# Patient Record
Sex: Male | Born: 1965 | Race: Black or African American | Hispanic: No | Marital: Married | Smoking: Never smoker
Health system: Southern US, Community
[De-identification: ages and names within clinical notes are randomized; demographics above are authoritative.]

## PROBLEM LIST (undated history)

## (undated) DIAGNOSIS — E785 Hyperlipidemia, unspecified: Secondary | ICD-10-CM

---

## 1999-05-08 ENCOUNTER — Emergency Department (HOSPITAL_COMMUNITY): Admission: EM | Admit: 1999-05-08 | Discharge: 1999-05-08 | Payer: Self-pay | Admitting: Emergency Medicine

## 1999-05-08 ENCOUNTER — Encounter: Payer: Self-pay | Admitting: Emergency Medicine

## 2008-07-06 ENCOUNTER — Ambulatory Visit: Payer: Self-pay | Admitting: Family Medicine

## 2008-07-06 DIAGNOSIS — S0180XA Unspecified open wound of other part of head, initial encounter: Secondary | ICD-10-CM | POA: Insufficient documentation

## 2008-07-06 DIAGNOSIS — J069 Acute upper respiratory infection, unspecified: Secondary | ICD-10-CM | POA: Insufficient documentation

## 2015-06-04 ENCOUNTER — Emergency Department (INDEPENDENT_AMBULATORY_CARE_PROVIDER_SITE_OTHER): Payer: BLUE CROSS/BLUE SHIELD

## 2015-06-04 ENCOUNTER — Emergency Department
Admission: EM | Admit: 2015-06-04 | Discharge: 2015-06-04 | Disposition: A | Discharge status: Home / Self Care | Payer: BLUE CROSS/BLUE SHIELD | Source: Home / Self Care | Attending: Family Medicine | Admitting: Family Medicine

## 2015-06-04 ENCOUNTER — Encounter: Payer: Self-pay | Admitting: Emergency Medicine

## 2015-06-04 DIAGNOSIS — M79645 Pain in left finger(s): Secondary | ICD-10-CM | POA: Diagnosis not present

## 2015-06-04 DIAGNOSIS — S63602A Unspecified sprain of left thumb, initial encounter: Secondary | ICD-10-CM | POA: Diagnosis not present

## 2015-06-04 MED ORDER — MELOXICAM 7.5 MG PO TABS: ORAL_TABLET | ORAL | Status: DC

## 2015-06-04 NOTE — Discharge Instructions (Signed)
°  Meloxicam (Mobic) is an antiinflammatory to help with pain and inflammation.  Do not take ibuprofen, Advil, Aleve, or any other medications that contain NSAIDs while taking meloxicam as this may cause stomach upset or even ulcers if taken in large amounts for an extended period of time.  ° °

## 2015-06-04 NOTE — ED Provider Notes (Signed)
CSN: [id number]     Arrival date & time 06/04/15  1006 History   First MD Initiated Contact with Patient 06/04/15 1036     Chief Complaint  Patient presents with  . Hand Pain   (Consider location/radiation/quality/duration/timing/severity/associated sxs/prior Treatment) HPI  Pt is a 50yo male presenting to [facility] with c/o persistent Left thumb pain after pt jammed it about 5 months ago.  Pt states he accidentally hit it against a hard surface. Pain has been aching and sore at MCP joint since then.  Worse with palpation of the joint and certain movements especially when applying pressure to joint such as grasping objects. Pain is 5/10 at worst.  He has tried acetaminophen and ibuprofen with temporary relief. Denies numbness or tingling in the thumb. No other injuries. He is Right hand dominant.   History reviewed. No pertinent past medical history. History reviewed. No pertinent past surgical history. No family history on file. Social History  Substance Use Topics  . Smoking status: Never Smoker   . Smokeless tobacco: None  . Alcohol Use: Yes    Review of Systems  Musculoskeletal: Positive for myalgias and arthralgias. Negative for joint swelling.       Left thumb  Skin: Negative for color change, rash and wound.  Neurological: Negative for weakness and numbness.    Allergies  Review of patient's allergies indicates no known allergies.  Home Medications   Prior to Admission medications   Medication Sig Start Date End Date Taking? Authorizing Provider  meloxicam (MOBIC) 7.5 MG tablet Take 2 tabs (15mg ) once daily for 5 days, then 1 tab (7.5mg ) daily as needed for pain 06/04/15   Junius Finner, PA-C   Meds Ordered and Administered this Visit  Medications - No data to display  BP 128/88 mmHg  Pulse 79  Temp(Src) 97.9 F (36.6 C) (Oral)  Ht 5\' 11"  (1.803 m)  Wt 233 lb (105.688 kg)  BMI 32.51 kg/m2  SpO2 96% No data found.   Physical Exam  Constitutional: He is oriented to  person, place, and time. He appears well-developed and well-nourished.  HENT:  Head: Normocephalic and atraumatic.  Eyes: EOM are normal.  Neck: Normal range of motion.  Cardiovascular: Normal rate.   Left thumb: cap refill < 3 seconds  Pulmonary/Chest: Effort normal.  Musculoskeletal: Normal range of motion.  Left thumb: no edema or deformity. Tenderness to MCP joint. No crepitus. Full ROM. Increased pain with flexion and extension against resistance.  No "snuffbox" tenderness.   Neurological: He is alert and oriented to person, place, and time.  Left thumb: normal sensation  Skin: Skin is warm and dry.  Left thumb: skin in tact, no ecchymosis, erythema or warmth  Psychiatric: He has a normal mood and affect. His behavior is normal.  Nursing note and vitals reviewed.   ED Course  Procedures (including critical care time)  Labs Review Labs Reviewed - No data to display  Imaging Review Dg Hand Complete Left  06/04/2015  CLINICAL DATA:  Left thumb pain after trauma 5 months ago. EXAM: LEFT HAND - COMPLETE 3+ VIEW COMPARISON:  None. FINDINGS: There is no evidence of fracture or dislocation. There is no evidence of arthropathy or other focal bone abnormality. Soft tissues are unremarkable. IMPRESSION: Normal left hand. Electronically Signed   By: Lupita Raider, M.D.   On: 06/04/2015 11:29      MDM   1. Left thumb sprain, initial encounter     Pt c/o 5 months of persistent  Left thumb pain. PMS in tact. No evidence of underlying infection.  Plain films: Normal Will tx as sprain.   Discussed use of removable thumb spica splint. Pt declined for now. Rx: meloxicam Encouraged to f/u with Sports Medicine in 1-2 weeks if not improving. Patient verbalized understanding and agreement with treatment plan.   Junius Finnerrin O'Malley, PA-C 06/04/15 1146

## 2015-06-04 NOTE — ED Notes (Signed)
Left thumb pain x 5 months, jammed it and it still bothers him, 5/10 with activity

## 2015-06-04 NOTE — ED Notes (Signed)
Patient refused thumb spica

## 2015-07-13 ENCOUNTER — Emergency Department
Admission: EM | Admit: 2015-07-13 | Discharge: 2015-07-13 | Disposition: A | Discharge status: Home / Self Care | Payer: BLUE CROSS/BLUE SHIELD | Source: Home / Self Care | Attending: Family Medicine | Admitting: Family Medicine

## 2015-07-13 ENCOUNTER — Encounter: Payer: Self-pay | Admitting: Emergency Medicine

## 2015-07-13 DIAGNOSIS — J069 Acute upper respiratory infection, unspecified: Secondary | ICD-10-CM

## 2015-07-13 MED ORDER — BENZONATATE 100 MG PO CAPS: 100.0000 mg | ORAL_CAPSULE | Freq: Three times a day (TID) | ORAL | Status: DC

## 2015-07-13 NOTE — ED Provider Notes (Signed)
CSN: [id number]     Arrival date & time 07/13/15  1116 History   First MD Initiated Contact with Patient 07/13/15 1142     Chief Complaint  Patient presents with  . Cough   (Consider location/radiation/quality/duration/timing/severity/associated sxs/prior Treatment) HPI  The pt is a 50yo male presenting to [facility] with c/o mild intermittent dry cough with a subjective low grade fever two days ago.  Symptoms started about 5 days ago.  He feels run down with body aches. Cough is worse at night with mild shortness of breath and chest soreness with cough.  He has been taking mucinex with no relief. Denies hx of asthma. He has not taken acetaminophen or ibuprofen. Others sick at work. He has not received flu vaccine this year.   History reviewed. No pertinent past medical history. History reviewed. No pertinent past surgical history. History reviewed. No pertinent family history. Social History  Substance Use Topics  . Smoking status: Never Smoker   . Smokeless tobacco: None  . Alcohol Use: Yes    Review of Systems  Constitutional: Positive for fever, chills and fatigue.  HENT: Positive for congestion. Negative for ear pain, sore throat, trouble swallowing and voice change.   Respiratory: Positive for cough and shortness of breath.   Cardiovascular: Positive for chest pain. Negative for palpitations.  Gastrointestinal: Negative for nausea, vomiting, abdominal pain and diarrhea.  Musculoskeletal: Positive for myalgias and arthralgias. Negative for back pain.  Skin: Negative for rash.    Allergies  Review of patient's allergies indicates no known allergies.  Home Medications   Prior to Admission medications   Medication Sig Start Date End Date Taking? Authorizing Provider  benzonatate (TESSALON) 100 MG capsule Take 1 capsule (100 mg total) by mouth every 8 (eight) hours. 07/13/15   Junius Finner, PA-C  meloxicam (MOBIC) 7.5 MG tablet Take 2 tabs ( ) once daily for 5 days, then 1 tab  (7.5mg ) daily as needed for pain 06/04/15   Junius Finner, PA-C   Meds Ordered and Administered this Visit  Medications - No data to display  BP 134/88 mmHg  Pulse 96  Temp(Src) 98.7 F (37.1 C) (Oral)  Resp 16  Ht  (1.803 m)  Wt 240 lb 12 oz (109.203 kg)  BMI 33.59 kg/m2  SpO2 97% No data found.   Physical Exam  Constitutional: He appears well-developed and well-nourished.  HENT:  Head: Normocephalic and atraumatic.  Right Ear: Tympanic membrane normal.  Left Ear: Tympanic membrane normal.  Nose: Nose normal.  Mouth/Throat: Uvula is midline, oropharynx is clear and moist and mucous membranes are normal.  Eyes: Conjunctivae are normal. No scleral icterus.  Neck: Normal range of motion. Neck supple.  Cardiovascular: Normal rate, regular rhythm and normal heart sounds.   Pulmonary/Chest: Effort normal and breath sounds normal. No stridor. No respiratory distress. He has no wheezes. He has no rales. He exhibits no tenderness.  Abdominal: Soft. He exhibits no distension. There is no tenderness.  Musculoskeletal: Normal range of motion.  Lymphadenopathy:    He has no cervical adenopathy.  Neurological: He is alert.  Skin: Skin is warm and dry.  Nursing note and vitals reviewed.   ED Course  Procedures (including critical care time)  Labs Review Labs Reviewed - No data to display  Imaging Review No results found.     MDM   1. Acute upper respiratory infection    Pt c/o 5 days of URI symptoms. O2 Sat 97% on RA. Pt is afebrile. No signs  of bacterial infection on exam. Symptoms likely viral.   Rx: tessalon  Advised pt to use acetaminophen and ibuprofen as needed for fever and pain. Encouraged rest and fluids. F/u with PCP in 7-10 days if not improving, sooner if worsening. Pt verbalized understanding and agreement with tx plan.     Junius Finner, PA-C 07/13/15 1220

## 2015-07-13 NOTE — Discharge Instructions (Signed)
You may take 400-600mg  Ibuprofen (Motrin) every 6-8 hours for fever and pain  Alternate with Tylenol  You may take 500mg  Tylenol every 4-6 hours as needed for fever and pain  Follow-up with your primary care provider next week for recheck of symptoms if not improving.  Be sure to drink plenty of fluids and rest, at least 8hrs of sleep a night, preferably more while you are sick. Return urgent care or go to closest ER if you cannot keep down fluids/signs of dehydration, fever not reducing with Tylenol, difficulty breathing/wheezing, stiff neck, worsening condition, or other concerns (see below)    [employer] Mist Vaporizers Vaporizers may help relieve the symptoms of a cough and cold. They add moisture to the air, which helps mucus to become thinner and less sticky. This makes it easier to breathe and cough up secretions. Cool mist vaporizers do not cause serious burns like hot mist vaporizers, which may also be called steamers or humidifiers. Vaporizers have not been proven to help with colds. You should not use a vaporizer if you are allergic to mold. HOME CARE INSTRUCTIONS  Follow the package instructions for the vaporizer.  Do not use anything other than distilled water in the vaporizer.  Do not run the vaporizer all of the time. This can cause mold or bacteria to grow in the vaporizer.  Clean the vaporizer after each time it is used.  Clean and dry the vaporizer well before storing it.  Stop using the vaporizer if worsening respiratory symptoms develop.   This information is not intended to replace advice given to you by your health care provider. Make sure you discuss any questions you have with your health care provider.   Document Released: 02/12/2004 Document Revised: 05/22/2013 Document Reviewed: 10/04/2012 Elsevier Interactive Patient Education 2016 Elsevier Inc.  Cough, Adult A cough helps to clear your throat and lungs. A cough may last only 2-3 weeks (acute), or it may last  longer than 8 weeks (chronic). Many different things can cause a cough. A cough may be a sign of an illness or another medical condition. HOME CARE  Pay attention to any changes in your cough.  Take medicines only as told by your doctor.  If you were prescribed an antibiotic medicine, take it as told by your doctor. Do not stop taking it even if you start to feel better.  Talk with your doctor before you try using a cough medicine.  Drink enough fluid to keep your pee (urine) clear or pale yellow.  If the air is dry, use a cold steam vaporizer or humidifier in your home.  Stay away from things that make you cough at work or at home.  If your cough is worse at night, try using extra pillows to raise your head up higher while you sleep.  Do not smoke, and try not to be around smoke. If you need help quitting, ask your doctor.  Do not have caffeine.  Do not drink alcohol.  Rest as needed. GET HELP IF:  You have new problems (symptoms).  You cough up yellow fluid (pus).  Your cough does not get better after 2-3 weeks, or your cough gets worse.  Medicine does not help your cough and you are not sleeping well.  You have pain that gets worse or pain that is not helped with medicine.  You have a fever.  You are losing weight and you do not know why.  You have night sweats. GET HELP RIGHT AWAY IF:  You cough up blood.  You have trouble breathing.  Your heartbeat is very fast.   This information is not intended to replace advice given to you by your health care provider. Make sure you discuss any questions you have with your health care provider.   Document Released: 01/28/2011 Document Revised: 02/05/2015 Document Reviewed: 07/24/2014 Elsevier Interactive Patient Education 2016 Elsevier Inc.  Upper Respiratory Infection, Adult Most upper respiratory infections (URIs) are caused by a virus. A URI affects the nose, throat, and upper air passages. The most common type of  URI is often called "the common cold." HOME CARE   Take medicines only as told by your doctor.  Gargle warm saltwater or take cough drops to comfort your throat as told by your doctor.  Use a warm mist humidifier or inhale steam from a shower to increase air moisture. This may make it easier to breathe.  Drink enough fluid to keep your pee (urine) clear or pale yellow.  Eat soups and other clear broths.  Have a healthy diet.  Rest as needed.  Go back to work when your fever is gone or your doctor says it is okay.  You may need to stay home longer to avoid giving your URI to others.  You can also wear a face mask and wash your hands often to prevent spread of the virus.  Use your inhaler more if you have asthma.  Do not use any tobacco products, including cigarettes, chewing tobacco, or electronic cigarettes. If you need help quitting, ask your doctor. GET HELP IF:  You are getting worse, not better.  Your symptoms are not helped by medicine.  You have chills.  You are getting more short of breath.  You have brown or red mucus.  You have yellow or brown discharge from your nose.  You have pain in your face, especially when you bend forward.  You have a fever.  You have puffy (swollen) neck glands.  You have pain while swallowing.  You have white areas in the back of your throat. GET HELP RIGHT AWAY IF:   You have very bad or constant:  Headache.  Ear pain.  Pain in your forehead, behind your eyes, and over your cheekbones (sinus pain).  Chest pain.  You have long-lasting (chronic) lung disease and any of the following:  Wheezing.  Long-lasting cough.  Coughing up blood.  A change in your usual mucus.  You have a stiff neck.  You have changes in your:  Vision.  Hearing.  Thinking.  Mood. MAKE SURE YOU:   Understand these instructions.  Will watch your condition.  Will get help right away if you are not doing well or get worse.     This information is not intended to replace advice given to you by your health care provider. Make sure you discuss any questions you have with your health care provider.   Document Released: 11/03/2007 Document Revised: 10/01/2014 Document Reviewed: 08/22/2013 Elsevier Interactive Patient Education [employer]

## 2015-07-13 NOTE — ED Notes (Signed)
Patient presents to [facility] with c/o dry cough since Tuesday low grade fever two days ago, feels rundown and symptoms worse at night

## 2017-02-20 IMAGING — CR DG HAND COMPLETE 3+V*L*
3 series · 3 of 3 positions shown · non-contrast
Comparison: None.

CLINICAL DATA: Left thumb pain after trauma 5 months ago.

EXAM:
LEFT HAND - COMPLETE 3+ VIEW

[hand pa]
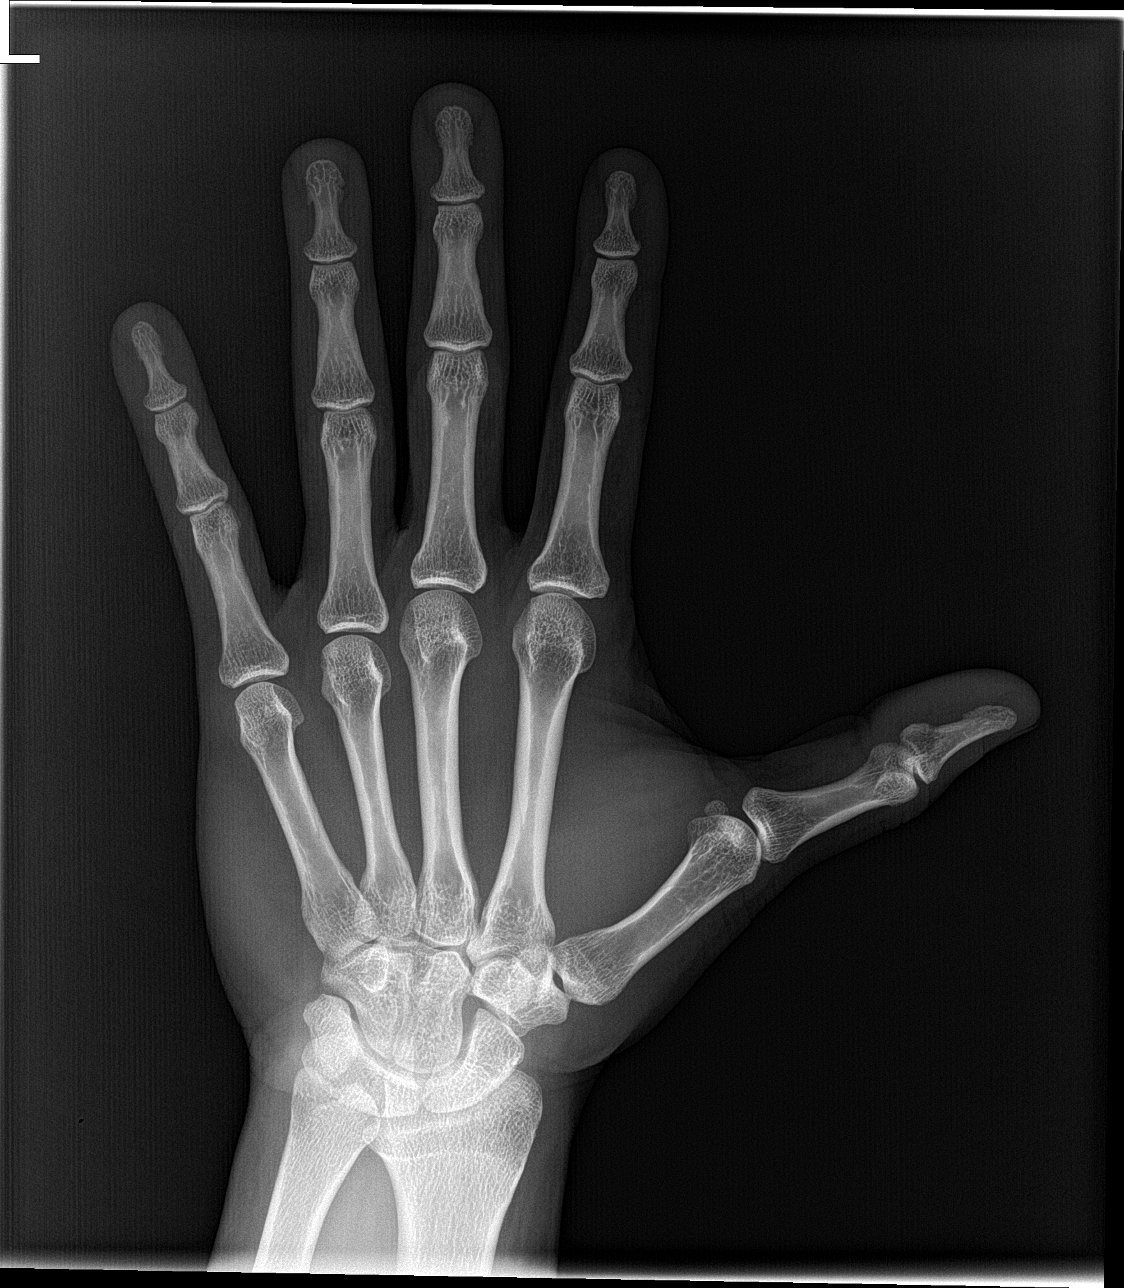

[hand obl]
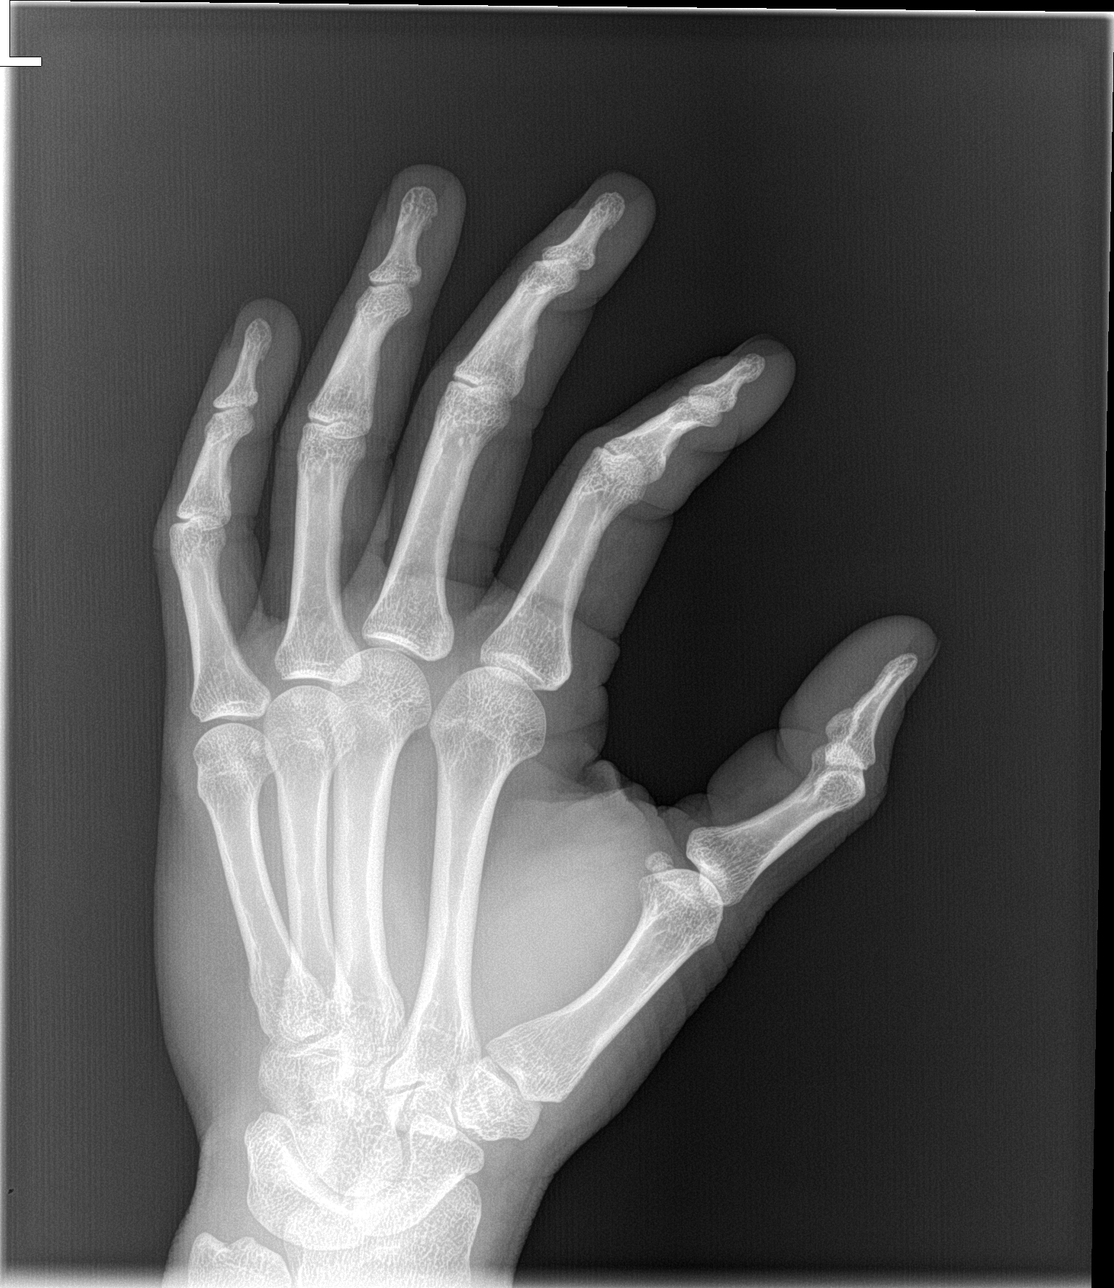

[hand lat]
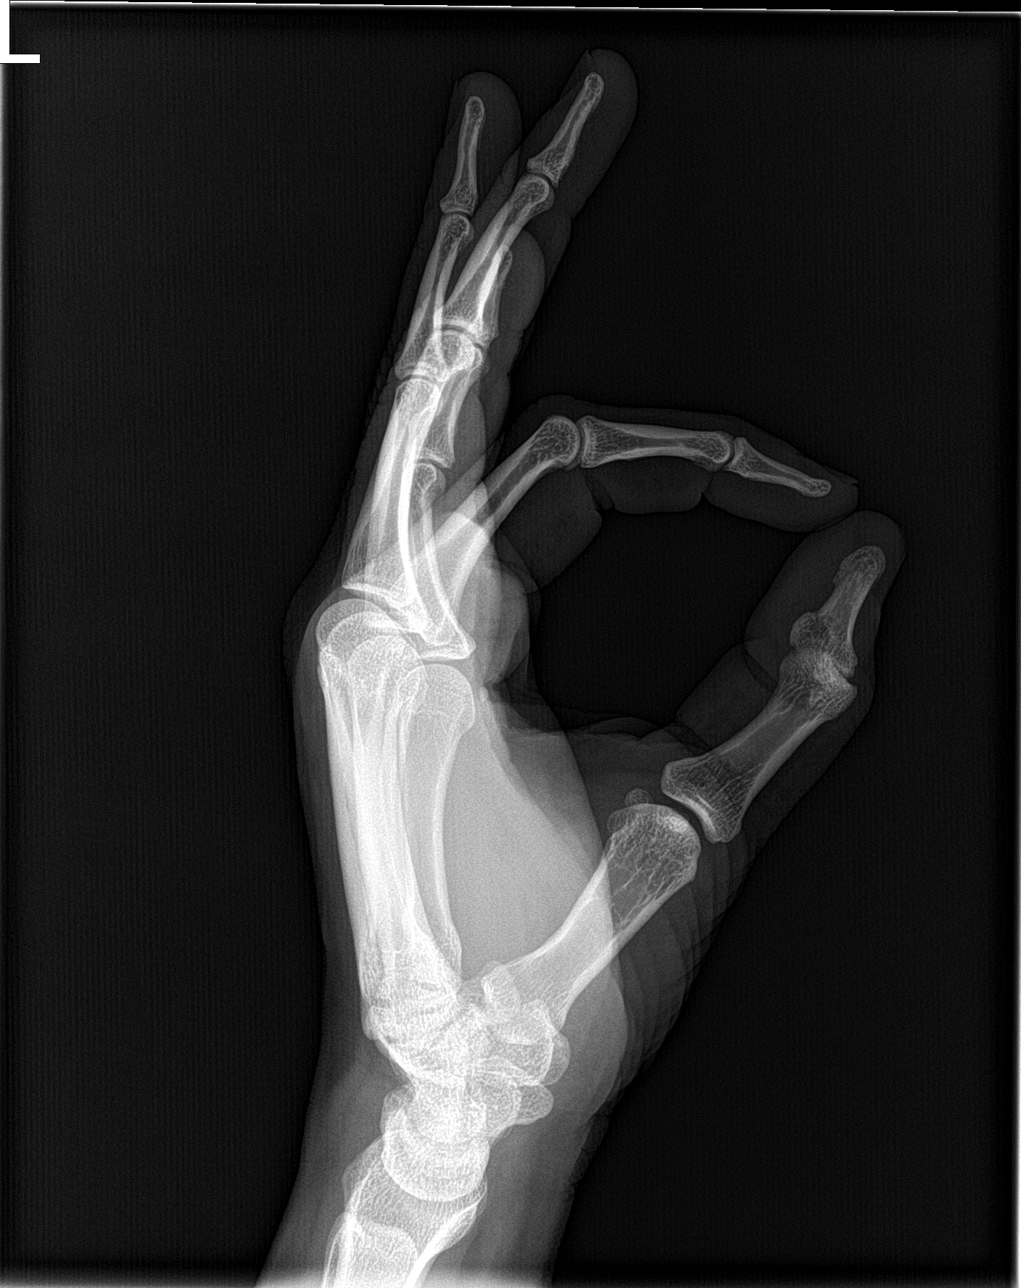

[3 of 3 positions shown; findings below may reference images not displayed]

FINDINGS: There is no evidence of fracture or dislocation. There is no
evidence of arthropathy or other focal bone abnormality. Soft
tissues are unremarkable.
IMPRESSION: Normal left hand.

## 2018-06-16 ENCOUNTER — Emergency Department (INDEPENDENT_AMBULATORY_CARE_PROVIDER_SITE_OTHER)
Admission: EM | Admit: 2018-06-16 | Discharge: 2018-06-16 | Disposition: A | Discharge status: Other Acute Inpatient Hospital | Payer: BLUE CROSS/BLUE SHIELD | Source: Home / Self Care | Attending: Emergency Medicine | Admitting: Emergency Medicine

## 2018-06-16 ENCOUNTER — Other Ambulatory Visit: Payer: Self-pay

## 2018-06-16 ENCOUNTER — Encounter: Payer: Self-pay | Admitting: *Deleted

## 2018-06-16 DIAGNOSIS — J101 Influenza due to other identified influenza virus with other respiratory manifestations: Secondary | ICD-10-CM

## 2018-06-16 DIAGNOSIS — R55 Syncope and collapse: Secondary | ICD-10-CM

## 2018-06-16 DIAGNOSIS — R9431 Abnormal electrocardiogram [ECG] [EKG]: Secondary | ICD-10-CM | POA: Diagnosis not present

## 2018-06-16 HISTORY — DX: Hyperlipidemia, unspecified: E78.5

## 2018-06-16 LAB — POCT INFLUENZA A/B
Influenza A, POC: POSITIVE — AB
Influenza B, POC: NEGATIVE

## 2018-06-16 LAB — POCT FASTING CBG KUC MANUAL ENTRY: POCT Glucose (KUC): 119 mg/dL — AB (ref 70–99)

## 2018-06-16 MED ORDER — GENERIC EXTERNAL MEDICATION
10.00 | Status: DC
Start: ? — End: 2018-06-16

## 2018-06-16 MED ORDER — SODIUM CHLORIDE 0.9 % IV SOLN
10.00 | INTRAVENOUS | Status: DC
Start: ? — End: 2018-06-16

## 2018-06-16 NOTE — Discharge Instructions (Addendum)
They are transporting you to the hospital for evaluation.

## 2018-06-16 NOTE — ED Provider Notes (Addendum)
[facility]    CSN: [id number] Arrival date & time: 06/16/18  0109     History   Chief Complaint Chief Complaint  Patient presents with  . Loss of Consciousness  . Fever    HPI Bobby Green is a 53 y.o. male.  Patient was in his usual state of health until this morning when he had an episode of syncope.  Of note he has been ill since Tuesday with fever chills sore throat and cough.  He has not had a flu shot.  He was taken to the room and had a second syncopal episode which lasted about 15 seconds.  The first episode was about 30 seconds.  He was somewhat slow to respond in our office but over the next 2 to 3 minutes responded.  There is no seizure-like activity with his syncope. HPI  Past Medical History:  Diagnosis Date  . Hyperlipidemia     Patient Active Problem List   Diagnosis Date Noted  . URI 07/06/2008  . LACERATION, FACE 07/06/2008    History reviewed. No pertinent surgical history.     Home Medications    Prior to Admission medications   Medication Sig Start Date End Date Taking? Authorizing Provider  pravastatin (PRAVACHOL) 40 MG tablet  06/12/18   [provider]    Family History History reviewed. No pertinent family history.  Social History Social History   Tobacco Use  . Smoking status: Never Smoker  . Smokeless tobacco: Never Used  Substance Use Topics  . Alcohol use: Yes  . Drug use: Not on file     Allergies   Patient has no known allergies.   Review of Systems Review of Systems  Constitutional: Positive for fatigue and fever.  HENT: Positive for sore throat.   Respiratory: Positive for cough.   Neurological: Positive for syncope.     Physical Exam Triage Vital Signs ED Triage Vitals [06/16/18 0902]  Enc Vitals Group     BP 124/78     Pulse Rate 85     Resp 14     Temp 99.2 F (37.3 C)     Temp Source Oral     SpO2 99 %     Weight 243 lb (110.2 kg)     Height      Head Circumference      Peak  Flow      Pain Score 0     Pain Loc      Pain Edu?      Excl. in GC?    No data found.  Updated Vital Signs BP 124/78 (BP Location: Right Arm)   Pulse 85   Temp 99.2 F (37.3 C) (Oral)   Resp 14   Wt 110.2 kg   SpO2 99%   BMI 33.89 kg/m   Visual Acuity Right Eye Distance:   Left Eye Distance:   Bilateral Distance:    Right Eye Near:   Left Eye Near:    Bilateral Near:     Physical Exam Constitutional:      Comments: Ill-appearing male lying flat initially not responsive but over 15 seconds became responsive answers questions and identified his wife and nurse.  No seizure activity noted.  HENT:     Nose: Nose normal.  Neck:     Musculoskeletal: Normal range of motion.  Cardiovascular:     Rate and Rhythm: Normal rate and regular rhythm.      UC Treatments / Results  Labs (all labs  ordered are listed, but only abnormal results are displayed) Labs Reviewed  POCT INFLUENZA A/B - Abnormal; Notable for the following components:      Result Value   Influenza A, POC Positive (*)    All other components within normal limits  POCT FASTING CBG [facility] MANUAL ENTRY - Abnormal; Notable for the following components:   POCT Glucose ([facility]) 119 (*)    All other components within normal limits    EKG EKG shows diffuse T wave changes to 3 and aVF as well as across the precordium. Radiology No results found.  Procedures Procedures (including critical care time)  Medications Ordered in UC Medications - No data to display  Initial Impression / Assessment and Plan / UC Course  I have reviewed the triage vital signs and the nursing notes.  Pertinent labs & imaging results that were available during my care of the patient were reviewed by me and considered in my medical decision making (see chart for details). Patient presents with syncopal episode x2 testing positive for influenza A.  His EKG is abnormal with diffuse T wave changes.  He is not complaining of chest pain or  shortness of breath at this time.  Will transport to the hospital via EMS for evaluation.  Blood sugar checked was 119.     Final Clinical Impressions(s) / UC Diagnoses   Final diagnoses:  Influenza A  Syncope, unspecified syncope type  Nonspecific abnormal electrocardiogram (ECG) (EKG)     Discharge Instructions     They are transporting you to the hospital for evaluation.    ED Prescriptions    None     Controlled Substance Prescriptions [place] Controlled Substance Registry consulted? Not Applicable   Collene Gobble, MD 06/16/18 1305    Collene Gobble, MD 06/16/18 1311

## 2018-06-16 NOTE — ED Triage Notes (Signed)
Patient reports 3 days of low grade fever, sweats/chills and minimal cough. His wife reports he has eaten very little and had little to drink in the past 2 days. This Am he took 1 sip of Theraflu, became diaphoretic and lost consciousness witnessed by his wife, for about 15 seconds. Fingerstick Glucose 119

## 2018-06-16 NOTE — ED Triage Notes (Signed)
While in exam room patient had 2nd event of loss of consciousness with diaphoresis, and mild shaking. Repeat BP 96/59 HR 58. Once awake recognized his wife and myself. MD in room. EKG Done, IV to left AC started, fluids open. EMS called for transport.

## 2018-06-18 ENCOUNTER — Telehealth: Payer: Self-pay

## 2018-06-18 NOTE — Telephone Encounter (Signed)
Pt says hes feeling better after his UC and ER visit. Advised pt to call with any questions. F/u with PCP for further care.

## 2023-06-09 ENCOUNTER — Ambulatory Visit (HOSPITAL_BASED_OUTPATIENT_CLINIC_OR_DEPARTMENT_OTHER)
Admission: EM | Admit: 2023-06-09 | Discharge: 2023-06-10 | Disposition: A | Discharge status: Home / Self Care | Payer: No Typology Code available for payment source | Attending: Emergency Medicine | Admitting: Emergency Medicine

## 2023-06-09 ENCOUNTER — Other Ambulatory Visit: Payer: Self-pay

## 2023-06-09 DIAGNOSIS — R7989 Other specified abnormal findings of blood chemistry: Secondary | ICD-10-CM | POA: Diagnosis not present

## 2023-06-09 DIAGNOSIS — R9431 Abnormal electrocardiogram [ECG] [EKG]: Secondary | ICD-10-CM

## 2023-06-09 DIAGNOSIS — Z79899 Other long term (current) drug therapy: Secondary | ICD-10-CM | POA: Insufficient documentation

## 2023-06-09 DIAGNOSIS — E785 Hyperlipidemia, unspecified: Secondary | ICD-10-CM | POA: Diagnosis not present

## 2023-06-09 DIAGNOSIS — I214 Non-ST elevation (NSTEMI) myocardial infarction: Secondary | ICD-10-CM | POA: Diagnosis present

## 2023-06-09 DIAGNOSIS — R55 Syncope and collapse: Secondary | ICD-10-CM | POA: Diagnosis present

## 2023-06-09 LAB — CBC
HCT: 42.4 % (ref 39.0–52.0)
Hemoglobin: 13.9 g/dL (ref 13.0–17.0)
MCH: 26.3 pg (ref 26.0–34.0)
MCHC: 32.8 g/dL (ref 30.0–36.0)
MCV: 80.3 fL (ref 80.0–100.0)
Platelets: 254 10*3/uL (ref 150–400)
RBC: 5.28 MIL/uL (ref 4.22–5.81)
RDW: 13.6 % (ref 11.5–15.5)
WBC: 8.6 10*3/uL (ref 4.0–10.5)
nRBC: 0 % (ref 0.0–0.2)

## 2023-06-09 LAB — BASIC METABOLIC PANEL
Anion gap: 13 (ref 5–15)
BUN: 20 mg/dL (ref 6–20)
CO2: 25 mmol/L (ref 22–32)
Calcium: 9.2 mg/dL (ref 8.9–10.3)
Chloride: 102 mmol/L (ref 98–111)
Creatinine, Ser: 1.84 mg/dL — ABNORMAL HIGH (ref 0.61–1.24)
GFR, Estimated: 42 mL/min — ABNORMAL LOW (ref >60)
Glucose, Bld: 105 mg/dL — ABNORMAL HIGH (ref 70–99)
Potassium: 3.5 mmol/L (ref 3.5–5.1)
Sodium: 140 mmol/L (ref 135–145)

## 2023-06-09 LAB — URINALYSIS, ROUTINE W REFLEX MICROSCOPIC
Bilirubin Urine: NEGATIVE
Glucose, UA: NEGATIVE mg/dL
Hgb urine dipstick: NEGATIVE
Ketones, ur: NEGATIVE mg/dL
Leukocytes,Ua: NEGATIVE
Nitrite: NEGATIVE
Protein, ur: NEGATIVE mg/dL
Specific Gravity, Urine: 1.02 (ref 1.005–1.030)
pH: 6.5 (ref 5.0–8.0)

## 2023-06-09 LAB — CBG MONITORING, ED: Glucose-Capillary: 121 mg/dL — ABNORMAL HIGH (ref 70–99)

## 2023-06-09 LAB — TROPONIN I (HIGH SENSITIVITY): Troponin I (High Sensitivity): 31 ng/L — ABNORMAL HIGH (ref <18)

## 2023-06-09 MED ORDER — SODIUM CHLORIDE 0.9 % IV BOLUS
1000.0000 mL | Freq: Once | INTRAVENOUS | Status: AC
  Administered 2023-06-09: 1000 mL via INTRAVENOUS

## 2023-06-09 NOTE — ED Provider Notes (Signed)
 Care assumed at shift change. Here with syncope while at the gym. Has an abnormal EKG and borderline elevated Trop #1. Awaiting delta and plan for admission. Cards vs Hospitalist.  Physical Exam  BP 122/80   Pulse 84   Temp 98.3 F (36.8 C)   Resp 18   Ht 5' 11 (1.803 m)   Wt 111.1 kg   SpO2 96%   BMI 34.17 kg/m   Physical Exam  Procedures  .Critical Care  Performed by: Roselyn Carlin NOVAK, MD Authorized by: Roselyn Carlin NOVAK, MD   Critical care provider statement:    Critical care time (minutes):  35   Critical care time was exclusive of:  Separately billable procedures and treating other patients   Critical care was necessary to treat or prevent imminent or life-threatening deterioration of the following conditions:  Cardiac failure   Critical care was time spent personally by me on the following activities:  Development of treatment plan with patient or surrogate, discussions with consultants, evaluation of patient's response to treatment, examination of patient, ordering and review of laboratory studies, ordering and review of radiographic studies, ordering and performing treatments and interventions, pulse oximetry, re-evaluation of patient's condition and review of old charts   Care discussed with: admitting provider     ED Course / MDM   Clinical Course as of 06/10/23 0044  Thu Jun 09, 2023  2243 Discussed with patient that I would like to admit him to the hospital for further evaluation and workup of syncope with concerning EKG changes. He is adamant he does not want to stay in the hospital. I explained the AMA process to him. His wife will try to convince him to stay.  [LR]  Fri Jun 10, 2023  0034 Repeat Trop is increased to 401. Patient remains asymptomatic. Repeat EKG not significantly changed. Will discuss with cardiology, patient now amenable to admission.  [CS]  9957 Spoke with Dr. Floretta, Cardiology, who will accept for admission. Heparin  and ASA ordered. [CS]     Clinical Course User Index [CS] Roselyn Carlin NOVAK, MD [LR] Corie Friddie DASEN, PA-C   Medical Decision Making Problems Addressed: Abnormal EKG: acute illness or injury NSTEMI (non-ST elevated myocardial infarction) [facility]): acute illness or injury Syncope, unspecified syncope type: acute illness or injury  Amount and/or Complexity of Data Reviewed Labs: ordered. Decision-making details documented in ED Course. ECG/medicine tests: ordered and independent interpretation performed. Decision-making details documented in ED Course.  Risk OTC drugs. Prescription drug management. Decision regarding hospitalization.          Roselyn Carlin NOVAK, MD 06/10/23 [phone]

## 2023-06-09 NOTE — ED Triage Notes (Signed)
 Pt POV after syncopal episode at gym. Witnessed syncopal episode in ED lobby as well, diaphoretic.

## 2023-06-09 NOTE — ED Provider Notes (Signed)
  EMERGENCY DEPARTMENT AT [facility] Provider Note   CSN: [id number] Arrival date & time: 06/09/23  2059     History  Chief Complaint  Patient presents with   Loss of Consciousness    Bobby Green is a 58 y.o. male with history of hyperlipidemia presents the emergency department after syncopal episode.  Patient was working out at [employer].  He states that he walked 3 miles and was using some weight machines, when he started feeling very lightheaded.  He did not have any chest pain.  He checked his blood pressure and says that it was having a hard time reading.  He waited a few minutes and so that he still felt very lightheaded.  Reported by bystanders and had a syncopal episode.  He had an additional witnessed episode in the ER lobby.  He believes he is dehydrated, he states that he does not frequently drink water.  He felt fine during the rest of his workout until the episode in question today.  Patient last saw cardiologist and had stress test about 15 years ago.  Reports he has had a few syncopal episodes in the past, most recently about a year and a half ago.   Loss of Consciousness      Home Medications Prior to Admission medications   Medication Sig Start Date End Date Taking? Authorizing Provider  pravastatin  (PRAVACHOL ) 40 MG tablet  06/12/18   [provider]      Allergies    Patient has no known allergies.    Review of Systems   Review of Systems  Cardiovascular:  Positive for syncope.  Neurological:  Positive for syncope.  All other systems reviewed and are negative.   Physical Exam Updated Vital Signs BP 96/67   Pulse 80   Resp 19   Ht 5' 11 (1.803 m)   Wt 111.1 kg   SpO2 98%   BMI 34.17 kg/m  Physical Exam Vitals and nursing note reviewed.  Constitutional:      Appearance: Normal appearance.  HENT:     Head: Normocephalic and atraumatic.  Eyes:     Conjunctiva/sclera: Conjunctivae normal.  Cardiovascular:      Rate and Rhythm: Normal rate and regular rhythm.  Pulmonary:     Effort: Pulmonary effort is normal. No respiratory distress.     Breath sounds: Normal breath sounds.  Abdominal:     General: There is no distension.     Palpations: Abdomen is soft.     Tenderness: There is no abdominal tenderness.  Skin:    General: Skin is warm and dry.  Neurological:     General: No focal deficit present.     Mental Status: He is alert.     ED Results / Procedures / Treatments   Labs (all labs ordered are listed, but only abnormal results are displayed) Labs Reviewed  BASIC METABOLIC PANEL - Abnormal; Notable for the following components:      Result Value   Glucose, Bld 105 (*)    Creatinine, Ser 1.84 (*)    GFR, Estimated 42 (*)    All other components within normal limits  CBG MONITORING, ED - Abnormal; Notable for the following components:   Glucose-Capillary 121 (*)    All other components within normal limits  TROPONIN I (HIGH SENSITIVITY) - Abnormal; Notable for the following components:   Troponin I (High Sensitivity) 31 (*)    All other components within normal limits  CBC  URINALYSIS, ROUTINE  W REFLEX MICROSCOPIC  TROPONIN I (HIGH SENSITIVITY)    EKG EKG Interpretation Date/Time:  Thursday June 09 2023 21:04:57 EST Ventricular Rate:  82 PR Interval:  179 QRS Duration:  95 QT Interval:  427 QTC Calculation: 499 R Axis:   46  Text Interpretation: Sinus rhythm Prominent P waves, nondiagnostic Abnormal R-wave progression, early transition Repol abnrm, severe global ischemia (LM/MVD) Since last tracing Worsening ST depression and T-wave inversion. Confirmed by Roselyn Dunnings [phone]) on 06/09/2023 11:21:48 PM  Radiology No results found.  Procedures Procedures    Medications Ordered in ED Medications  sodium chloride  0.9 % bolus 1,000 mL (0 mLs Intravenous Stopped 06/09/23 2225)    ED Course/ Medical Decision Making/ A&P Clinical Course as of 06/09/23 2332  Thu  Jun 09, 2023  2243 Discussed with patient that I would like to admit him to the hospital for further evaluation and workup of syncope with concerning EKG changes. He is adamant he does not want to stay in the hospital. I explained the AMA process to him. His wife will try to convince him to stay.  [LR]    Clinical Course User Index [LR] Sevon Rotert, Friddie DASEN, PA-C                                 Medical Decision Making Amount and/or Complexity of Data Reviewed Labs: ordered.   This patient is a 59 y.o. male who presents to the ED for concern of syncope, this involves an extensive number of treatment options, and is a complaint that carries with it a high risk of complications and morbidity. The emergent differential diagnosis prior to evaluation includes, but is not limited to,  CVA, ACS, arrhythmia, vasovagal syncope, orthostatic hypotension, sepsis, hypoglycemia, electrolyte disturbance, respiratory failure, symptomatic anemia, dehydration, heat injury, polypharmacy, malignancy, anxiety/panic attack. This is not an exhaustive differential.   Past Medical History / Co-morbidities / Social History: HLD  Additional history: Chart reviewed. Pertinent results include: reviewed ER visit note from 2020 after syncopal episode, was thought to be due to dehydration in setting of influenza  Physical Exam: Physical exam performed. The pertinent findings include: hypotensive to 83/49. Lying flat in exam bed, awake and oriented. Not complaining of any pain. Heart regular rate and rhythm. Lung sounds clear. No peripheral edema.   Lab Tests: I ordered, and personally interpreted labs.  The pertinent results include: CBC normal.  BMP with creatinine 1.4, normal BUN, GFR 42.  Compared to prior labs from 2020, kidney function slightly worse compared to prior.   Imaging Studies: Bedside echo performed by attending physician Dr Mannie, appears normal.    Cardiac Monitoring:  The patient was maintained on a  cardiac monitor.  My attending physician Dr. Mannie viewed and interpreted the cardiac monitored which showed an underlying rhythm of: sinus rhythm, with worsening T wave inversion globally compared to prior. I agree with this interpretation.   Medications: I ordered medication including IVF  for hypotension. Reevaluation of the patient after these medicines showed that the patient improved. I have reviewed the patients home medicines and have made adjustments as needed.   Disposition: Patient discussed and care transferred to attending physician Dr. Roselyn at shift change. Please see his/her note for further details regarding further ED course and disposition. Plan at time of handoff is follow up on delta troponin. At time of lab drawn, patient still hesitant about staying in the hospital. Plan for reassessment  once delta troponin resulted and determine disposition. Patient will likely still need admission, and would need to sign out AMA if he refuses that.    I discussed this case with my attending physician Dr. Mannie who cosigned this note including patient's presenting symptoms, physical exam, and planned diagnostics and interventions. Attending physician stated agreement with plan or made changes to plan which were implemented.    Final Clinical Impression(s) / ED Diagnoses Final diagnoses:  Syncope, unspecified syncope type  Abnormal EKG    Rx / DC Orders ED Discharge Orders     None      Portions of this report may have been transcribed using voice recognition software. Every effort was made to ensure accuracy; however, inadvertent computerized transcription errors may be present.    Bali Lyn T, PA-C 06/09/23 2333    Mannie Pac T, DO 06/13/23 [phone]

## 2023-06-09 NOTE — ED Notes (Signed)
2nd troponin drawn

## 2023-06-10 ENCOUNTER — Encounter (HOSPITAL_BASED_OUTPATIENT_CLINIC_OR_DEPARTMENT_OTHER): Payer: Self-pay | Admitting: Cardiovascular Disease

## 2023-06-10 ENCOUNTER — Encounter (HOSPITAL_COMMUNITY)
Admission: EM | Disposition: A | Discharge status: Home / Self Care | Payer: Self-pay | Source: Home / Self Care | Attending: Emergency Medicine

## 2023-06-10 ENCOUNTER — Telehealth: Payer: Self-pay | Admitting: Cardiology

## 2023-06-10 DIAGNOSIS — R55 Syncope and collapse: Secondary | ICD-10-CM

## 2023-06-10 DIAGNOSIS — E785 Hyperlipidemia, unspecified: Secondary | ICD-10-CM | POA: Insufficient documentation

## 2023-06-10 DIAGNOSIS — I214 Non-ST elevation (NSTEMI) myocardial infarction: Secondary | ICD-10-CM | POA: Diagnosis present

## 2023-06-10 DIAGNOSIS — R7989 Other specified abnormal findings of blood chemistry: Secondary | ICD-10-CM

## 2023-06-10 DIAGNOSIS — Z79899 Other long term (current) drug therapy: Secondary | ICD-10-CM | POA: Diagnosis not present

## 2023-06-10 HISTORY — PX: LEFT HEART CATH AND CORONARY ANGIOGRAPHY: CATH118249

## 2023-06-10 LAB — TROPONIN I (HIGH SENSITIVITY)
Troponin I (High Sensitivity): 1405 ng/L (ref <18)
Troponin I (High Sensitivity): 401 ng/L (ref <18)
Troponin I (High Sensitivity): 724 ng/L (ref <18)

## 2023-06-10 LAB — HEPATIC FUNCTION PANEL
ALT: 17 U/L (ref 0–44)
ALT: 21 U/L (ref 0–44)
AST: 22 U/L (ref 15–41)
AST: 26 U/L (ref 15–41)
Albumin: 4 g/dL (ref 3.5–5.0)
Albumin: 3.4 g/dL — ABNORMAL LOW (ref 3.5–5.0)
Alkaline Phosphatase: 39 U/L (ref 38–126)
Alkaline Phosphatase: 38 U/L (ref 38–126)
Bilirubin, Direct: 0.1 mg/dL (ref 0.0–0.2)
Bilirubin, Direct: 0.2 mg/dL (ref 0.0–0.2)
Indirect Bilirubin: 0.5 mg/dL (ref 0.3–0.9)
Indirect Bilirubin: 0.7 mg/dL (ref 0.3–0.9)
Total Bilirubin: 0.6 mg/dL (ref 0.0–1.2)
Total Bilirubin: 0.9 mg/dL (ref 0.0–1.2)
Total Protein: 6.5 g/dL (ref 6.5–8.1)
Total Protein: 6.3 g/dL — ABNORMAL LOW (ref 6.5–8.1)

## 2023-06-10 LAB — LIPID PANEL
Cholesterol: 257 mg/dL — ABNORMAL HIGH (ref 0–200)
Cholesterol: 250 mg/dL — ABNORMAL HIGH (ref 0–200)
HDL: 45 mg/dL (ref >40)
HDL: 43 mg/dL (ref >40)
LDL Cholesterol: 195 mg/dL — ABNORMAL HIGH (ref 0–99)
LDL Cholesterol: 187 mg/dL — ABNORMAL HIGH (ref 0–99)
Total CHOL/HDL Ratio: 5.7 {ratio}
Total CHOL/HDL Ratio: 5.8 {ratio}
Triglycerides: 85 mg/dL (ref <150)
Triglycerides: 101 mg/dL (ref <150)
VLDL: 17 mg/dL (ref 0–40)
VLDL: 20 mg/dL (ref 0–40)

## 2023-06-10 LAB — MAGNESIUM: Magnesium: 2.1 mg/dL (ref 1.7–2.4)

## 2023-06-10 LAB — BASIC METABOLIC PANEL
Anion gap: 6 (ref 5–15)
BUN: 16 mg/dL (ref 6–20)
CO2: 25 mmol/L (ref 22–32)
Calcium: 8.7 mg/dL — ABNORMAL LOW (ref 8.9–10.3)
Chloride: 104 mmol/L (ref 98–111)
Creatinine, Ser: 1.19 mg/dL (ref 0.61–1.24)
GFR, Estimated: >60 mL/min (ref >60)
Glucose, Bld: 101 mg/dL — ABNORMAL HIGH (ref 70–99)
Potassium: 3.9 mmol/L (ref 3.5–5.1)
Sodium: 135 mmol/L (ref 135–145)

## 2023-06-10 LAB — HEPARIN LEVEL (UNFRACTIONATED): Heparin Unfractionated: 0.57 [IU]/mL (ref 0.30–0.70)

## 2023-06-10 LAB — TSH: TSH: 0.579 u[IU]/mL (ref 0.350–4.500)

## 2023-06-10 SURGERY — LEFT HEART CATH AND CORONARY ANGIOGRAPHY
Anesthesia: LOCAL

## 2023-06-10 MED ORDER — SODIUM CHLORIDE 0.9 % IV SOLN: INTRAVENOUS | Status: DC

## 2023-06-10 MED ORDER — POTASSIUM CHLORIDE CRYS ER 20 MEQ PO TBCR
EXTENDED_RELEASE_TABLET | ORAL | Status: AC
  Filled 2023-06-10: qty 1

## 2023-06-10 MED ORDER — ASPIRIN 81 MG PO CHEW
324.0000 mg | CHEWABLE_TABLET | Freq: Once | ORAL | Status: AC
  Administered 2023-06-10: 324 mg via ORAL
  Filled 2023-06-10: qty 4

## 2023-06-10 MED ORDER — MIDAZOLAM HCL 2 MG/2ML IJ SOLN
INTRAMUSCULAR | Status: DC | PRN
  Administered 2023-06-10: 2 mg via INTRAVENOUS

## 2023-06-10 MED ORDER — SODIUM CHLORIDE 0.9% FLUSH: 3.0000 mL | INTRAVENOUS | Status: DC | PRN

## 2023-06-10 MED ORDER — HEPARIN (PORCINE) IN NACL 1000-0.9 UT/500ML-% IV SOLN
INTRAVENOUS | Status: DC | PRN
  Administered 2023-06-10: 1000 mL

## 2023-06-10 MED ORDER — FENTANYL CITRATE (PF) 100 MCG/2ML IJ SOLN
INTRAMUSCULAR | Status: AC
  Filled 2023-06-10: qty 2

## 2023-06-10 MED ORDER — HEPARIN (PORCINE) 25000 UT/250ML-% IV SOLN
1300.0000 [IU]/h | INTRAVENOUS | Status: DC
  Administered 2023-06-10: 1300 [IU]/h via INTRAVENOUS
  Filled 2023-06-10: qty 250

## 2023-06-10 MED ORDER — SODIUM CHLORIDE 0.9% FLUSH: 3.0000 mL | Freq: Two times a day (BID) | INTRAVENOUS | Status: DC

## 2023-06-10 MED ORDER — HEPARIN BOLUS VIA INFUSION
4000.0000 [IU] | Freq: Once | INTRAVENOUS | Status: AC
  Administered 2023-06-10: 4000 [IU] via INTRAVENOUS

## 2023-06-10 MED ORDER — LIDOCAINE HCL (PF) 1 % IJ SOLN
INTRAMUSCULAR | Status: DC | PRN
  Administered 2023-06-10: 2 mL

## 2023-06-10 MED ORDER — ACETAMINOPHEN 325 MG PO TABS: 650.0000 mg | ORAL_TABLET | ORAL | Status: DC | PRN

## 2023-06-10 MED ORDER — HEPARIN SODIUM (PORCINE) 1000 UNIT/ML IJ SOLN
INTRAMUSCULAR | Status: AC
  Filled 2023-06-10: qty 10

## 2023-06-10 MED ORDER — MIDAZOLAM HCL 2 MG/2ML IJ SOLN
INTRAMUSCULAR | Status: AC
  Filled 2023-06-10: qty 2

## 2023-06-10 MED ORDER — VERAPAMIL HCL 2.5 MG/ML IV SOLN
INTRAVENOUS | Status: AC
Start: 2023-06-10 — End: ?
  Filled 2023-06-10: qty 2

## 2023-06-10 MED ORDER — IOHEXOL 350 MG/ML SOLN
INTRAVENOUS | Status: DC | PRN
  Administered 2023-06-10: 45 mL

## 2023-06-10 MED ORDER — HEPARIN SODIUM (PORCINE) 1000 UNIT/ML IJ SOLN
INTRAMUSCULAR | Status: DC | PRN
  Administered 2023-06-10: 5000 [IU] via INTRAVENOUS

## 2023-06-10 MED ORDER — VERAPAMIL HCL 2.5 MG/ML IV SOLN
INTRAVENOUS | Status: DC | PRN
  Administered 2023-06-10: 10 mL via INTRA_ARTERIAL

## 2023-06-10 MED ORDER — POTASSIUM CHLORIDE CRYS ER 20 MEQ PO TBCR
10.0000 meq | EXTENDED_RELEASE_TABLET | Freq: Once | ORAL | Status: AC
  Administered 2023-06-10: 10 meq via ORAL

## 2023-06-10 MED ORDER — ONDANSETRON HCL 4 MG/2ML IJ SOLN: 4.0000 mg | Freq: Four times a day (QID) | INTRAMUSCULAR | Status: DC | PRN

## 2023-06-10 MED ORDER — LIDOCAINE HCL (PF) 1 % IJ SOLN
INTRAMUSCULAR | Status: AC
Start: 2023-06-10 — End: ?
  Filled 2023-06-10: qty 30

## 2023-06-10 MED ORDER — FENTANYL CITRATE (PF) 100 MCG/2ML IJ SOLN
INTRAMUSCULAR | Status: DC | PRN
  Administered 2023-06-10: 25 ug via INTRAVENOUS

## 2023-06-10 MED ORDER — HYDRALAZINE HCL 20 MG/ML IJ SOLN
10.0000 mg | INTRAMUSCULAR | Status: DC | PRN
Start: 2023-06-10 — End: 2023-06-10

## 2023-06-10 MED ORDER — SODIUM CHLORIDE 0.9 % IV SOLN: 250.0000 mL | INTRAVENOUS | Status: DC | PRN

## 2023-06-10 SURGICAL SUPPLY — 9 items
CATH 5FR JL3.5 JR4 ANG PIG MP (CATHETERS) IMPLANT
DEVICE RAD COMP TR BAND LRG (VASCULAR PRODUCTS) IMPLANT
GLIDESHEATH SLEND SS 6F .021 (SHEATH) IMPLANT
GUIDEWIRE INQWIRE 1.5J.035X260 (WIRE) IMPLANT
INQWIRE 1.5J .035X260CM (WIRE) ×1
KIT SYRINGE INJ CVI SPIKEX1 (MISCELLANEOUS) IMPLANT
PACK CARDIAC CATHETERIZATION (CUSTOM PROCEDURE TRAY) ×1 IMPLANT
SET ATX-X65L (MISCELLANEOUS) IMPLANT
SHEATH PROBE COVER 6X72 (BAG) IMPLANT

## 2023-06-10 NOTE — Interval H&P Note (Signed)
 History and Physical Interval Note:  06/10/2023 3:07 PM  Bobby Green  has presented today for surgery, with the diagnosis of nstemi.  The various methods of treatment have been discussed with the patient and family. After consideration of risks, benefits and other options for treatment, the patient has consented to  Procedure(s): LEFT HEART CATH AND CORONARY ANGIOGRAPHY (N/A) as a surgical intervention.  The patient's history has been reviewed, patient examined, no change in status, stable for surgery.  I have reviewed the patient's chart and labs.  Questions were answered to the patient's satisfaction.    Cath Lab Visit (complete for each Cath Lab visit)  Clinical Evaluation Leading to the Procedure:   ACS: Yes.    Non-ACS:    Anginal Classification: CCS I  Anti-ischemic medical therapy: No Therapy  Non-Invasive Test Results: No non-invasive testing performed  Prior CABG: No previous CABG       [name] 06/10/2023  3:07 PM

## 2023-06-10 NOTE — ED Notes (Signed)
 [facility] called stating cl on the way to transport pt to cath lab

## 2023-06-10 NOTE — Telephone Encounter (Signed)
   Transition of Care Follow-up Phone Call Request    Patient Name: Bobby Green Date of Birth: March 04, 1966 Date of Encounter: 06/10/2023  Primary Care Provider:  Ilah Crigler, MD Primary Cardiologist:  None  Barnet Keach has been scheduled for a transition of care follow up appointment with a [facility] provider:  Friday Jun 24, 2023  Appt at 8:25 AM (25 min)      Bobby Green  Will plan to discharge patient on 2-week live ZIO monitor for syncope.  Dr. Jordan to read.  I have talked to [facility] with echocardiograms and she said she would schedule patient and call him on Monday to get this arranged.  Please be sure that this gets done sooner than later, should be done next week.  Please reach out to [facility] within 48 hours of discharge to confirm appointment and review transition of care protocol questionnaire. Anticipated discharge date: Today.  Thom LITTIE Sluder, PA-C  06/10/2023, 4:45 PM

## 2023-06-10 NOTE — H&P (Addendum)
 Cardiology Admission History and Physical   Patient ID: Bobby Green MRN: [id number]; DOB: Sep 19, 1965   Admission date: 06/09/2023  PCP:  Ilah Crigler, MD   [facility] Providers Cardiologist:  New Click here to update MD or APP on Care Team, Refresh:1}     Chief Complaint:  syncope  Patient Profile:   Bobby Green is a 58 y.o. male with history of recurrent syncope, HLD, prior renal insufficiency in labs (Cr 1.6 in 2011, 1.43 in 05/2018) who is being seen 06/10/2023 for the evaluation of syncope and possible NSTEMI.  History of Present Illness:   Bobby Green is originally from [place] but has been here in the [place]  for 27 years. He is an personnel officer. He has no formal cardiac history though does report he he has longstanding history of 4-5 prior episodes of syncope in the last 2-3 decades. The last episode was about 2 years ago. He remembers having a remote stress test for one episode that was reportedly OK. He gets a stereotypical lightheadedness when it happens. He reports he is usually out 15-20 minutes. There is no history of cardiac arrest. It looks like his creatinine has been elevated in the past by the spotty available labs noted above.  In general he is very active. He was working out at [employer] at [employer] yesterday. He had only had a cup of coffee and glass of water that day. He had walked 3 miles and was using weight machines when he felt very lightheaded. He checked his BP and had a hard time getting a reading. He waited a few minutes and symptoms persisted. He then reportedly had a syncope episode reported by bystanders. He thinks he was out very briefly. He still felt lightheaded after a few minutes of sitting so summoned care and was transported down to the ED in a wheelchair. Per his report he had an additional syncopal episode briefly while being wheeled down. He did not require any CPR or resuscitation. He denies any preceding chest pain or palpitations though did have some  SOB. He denies any b/b incontinence or seizure activity these episodes. Initial BP 83/49, P 83, glucose 121. HsTroponin 31->401, Cr 1.84. UA wnl. He received 1L fluid bolus and was started on heparin  and given 324mg  ASA. Recheck Cr 1.19. EKG shows NSR with diffuse STTW changes with mild STE avL, biphasic ST/TW changes V2, TWI I, II, III, avF, V3-V6 with ST depression in II, III, avF, V3-V6. While similar territory of changes were noted in 2020, these appear much more pronounced. We do not have tele data available from [facility] but were not notified from ED team of any arrhythmic events. VSS otherwise stable this AM, not tachycardic, tachypneic or hypoxic. He is NSR on arrival, HR 50s, without acute complaint.  He has no family hx of cardiac disease. His cousin's husband is a cardiologist and will also be helping him review his results.   Past Medical History:  Diagnosis Date   Hyperlipidemia     History reviewed. No pertinent surgical history.   Medications Prior to Admission: Prior to Admission medications   Medication Sig Start Date End Date Taking? Authorizing Provider  pravastatin  (PRAVACHOL ) 40 MG tablet  06/12/18   [provider]     Allergies:   No Known Allergies  Social History:   Social History   Socioeconomic History   Marital status: Married    Spouse name: Not on file   Number of children: Not on file  Years of education: Not on file   Highest education level: Not on file  Occupational History   Not on file  Tobacco Use   Smoking status: Never   Smokeless tobacco: Never  Substance and Sexual Activity   Alcohol use: Yes    Comment: 2x/month   Drug use: Never   Sexual activity: Not on file  Other Topics Concern   Not on file  Social History Narrative   Not on file   Social Drivers of Health   Financial Resource Strain: Not on file  Food Insecurity: Not on file  Transportation Needs: Not on file  Physical Activity: Not on file  Stress: Not on file   Social Connections: Not on file  Intimate Partner Violence: Not on file    Family History:   The patient's family history is negative for Heart disease.    ROS:  Please see the history of present illness.  All other ROS reviewed and negative.     Physical Exam/Data:   Vitals:   06/10/23 0730 06/10/23 0830 06/10/23 0925 06/10/23 0930  BP: 114/64 112/69 110/75 116/76  Pulse: 70 66 68 61  Resp: 19 18 13 16   Temp:      TempSrc:      SpO2: 96% 98% 99% 98%  Weight:      Height:        Intake/Output Summary (Last 24 hours) at 06/10/2023 0947 Last data filed at 06/09/2023 2225 Gross per 24 hour  Intake 1000 ml  Output --  Net 1000 ml      06/09/2023    9:02 PM 06/16/2018    9:02 AM 07/13/2015   11:37 AM  Last 3 Weights  Weight (lbs) 245 lb 243 lb 240 lb 12 oz  Weight (kg) 111.131 kg 110.224 kg 109.203 kg     Body mass index is 34.17 kg/m.  General: Well developed, well nourished, in no acute distress. Head: Normocephalic, atraumatic, sclera non-icteric, no xanthomas, nares are without discharge. Neck: Negative for carotid bruits. JVP not elevated. Lungs: Clear bilaterally to auscultation without wheezes, rales, or rhonchi. Breathing is unlabored. Heart: RRR S1 S2 without murmurs, rubs, or gallops.  Abdomen: Soft, non-tender, non-distended with normoactive bowel sounds. No rebound/guarding. Extremities: No clubbing or cyanosis. No edema. Distal pedal pulses are 2+ and equal bilaterally. Neuro: Alert and oriented X 3. Moves all extremities spontaneously. Psych:  Responds to questions appropriately with a normal affect.   EKG: Outlined above  Relevant CV Studies: See above  Laboratory Data:  High Sensitivity Troponin:   Recent Labs  Lab 06/09/23 2114 06/09/23 2338  TROPONINIHS 31* 401*      Chemistry Recent Labs  Lab 06/09/23 2114 06/10/23 0741  NA 140 135  K 3.5 3.9  CL 102 104  CO2 25 25  GLUCOSE 105* 101*  BUN 20 16  CREATININE 1.84* 1.19  CALCIUM 9.2  8.7*  GFRNONAA 42* >60  ANIONGAP 13 6    No results for input(s): PROT, ALBUMIN, AST, ALT, ALKPHOS, BILITOT in the last 168 hours. Lipids No results for input(s): CHOL, TRIG, HDL, LABVLDL, LDLCALC, CHOLHDL in the last 168 hours. Hematology Recent Labs  Lab 06/09/23 2114  WBC 8.6  RBC 5.28  HGB 13.9  HCT 42.4  MCV 80.3  MCH 26.3  MCHC 32.8  RDW 13.6  PLT 254   Thyroid  No results for input(s): TSH, FREET4 in the last 168 hours. BNPNo results for input(s): BNP, PROBNP in the last 168 hours.  DDimer  No results for input(s): DDIMER in the last 168 hours.   Radiology/Studies:  No results found.   Assessment and Plan:   1. Syncope, hypotension, elevated troponin, abnormal EKG - reports hx of episodic syncope over the last several decades, had prior reported normal stress test, details not available - Dr. Jordan recommends proceeding with LHC this AM for ischemic assessment - continue heparin  per pharmacy - got 324mg  ASA shortly before 1am, confirmed with cath lab team this suffices for today's dose - if ischemic workup negative, anticipate admission for obs on tele with 2D echo to exclude HOCM or structural heart disease, also anticipate monitor at DC with consideration of ILR as outpatient - will order f/u trending troponins, baseline TSH, Mg - ordered KCl for K 3.9 - follow calcium in AM (suspect related to IVF) - further med recs contingent on cath results  Informed Consent   Shared Decision Making/Informed Consent The risks [stroke (1 in 1000), death (1 in 1000), kidney failure [usually temporary] (1 in 500), bleeding (1 in 200), allergic reaction [possibly serious] (1 in 200)], benefits (diagnostic support and management of coronary artery disease) and alternatives of a cardiac catheterization were discussed in detail with Mr. Ross and he is willing to proceed.     2. AKI superimposed on possible CKD (prior renal insufficiency  noted in EMR, but not a lot of data available) - Cr 1.6 in 2011, 1.43 in 2020 - here admitted at 1.84 s/p IVF with improvement to 1.19  3. Hyperlipidemia - statin plan TBD based on cath results   Risk Assessment/Risk Scores:    TIMI Risk Score for Unstable Angina or Non-ST Elevation MI:   The patient's TIMI risk score is 2, which indicates a 8% risk of all cause mortality, new or recurrent myocardial infarction or need for urgent revascularization in the next 14 days.   Code Status: Full Code  Inpatient/Outpatient Status TBD post cath based on findings (had inpatient originally requested by EDP)  For questions or updates, please contact [facility] Please consult [email or web address] for contact info under     Signed, Keirra Zeimet N Burley Kopka, PA-C  06/10/2023 9:47 AM

## 2023-06-10 NOTE — Discharge Instructions (Signed)

## 2023-06-10 NOTE — Progress Notes (Signed)
 Next troponin 1405. Notified cath lab. Patient is slated to be next case with Dr. [place].

## 2023-06-10 NOTE — Progress Notes (Signed)
 PHARMACY - ANTICOAGULATION CONSULT NOTE  Pharmacy Consult for Heparin  Indication: chest pain/ACS  No Known Allergies  Patient Measurements: Height: 5' 11 (180.3 cm) Weight: 111.1 kg (245 lb) IBW/kg (Calculated) : 75.3 Heparin  Dosing Weight: 100 kg  Vital Signs: Temp: 98.3 F (36.8 C) (01/09 2355) BP: 122/80 (01/09 2330) Pulse Rate: 84 (01/09 2330)  Labs: Recent Labs    06/09/23 2114 06/09/23 2338  HGB 13.9  --   HCT 42.4  --   PLT 254  --   CREATININE 1.84*  --   TROPONINIHS 31* 401*    Estimated Creatinine Clearance: 56.8 mL/min (A) (by C-G formula based on SCr of 1.84 mg/dL (H)).   Medical History: Past Medical History:  Diagnosis Date   Hyperlipidemia     Medications:  No current facility-administered medications on file prior to encounter.   Current Outpatient Medications on File Prior to Encounter  Medication Sig Dispense Refill   pravastatin  (PRAVACHOL ) 40 MG tablet        Assessment: 58 y.o. male with syncope and elevated troponin, possible ACS, for heparin   Goal of Therapy:  Heparin  level 0.3-0.7 units/ml Monitor platelets by anticoagulation protocol: Yes   Plan:  Heparin  4000 units IV bolus, then start heparin  1300 units/hr Check heparin  level in 6 hours.   Dail Cordella Misty 06/10/2023,1:07 AM

## 2023-06-10 NOTE — Discharge Summary (Addendum)
 Discharge Summary    Patient ID: Bobby Green MRN: [id number]; DOB: [birth year]  Admit date: 06/09/2023 Discharge date: 06/10/2023  PCP:  Ilah Crigler, MD   Bobby Green Providers Cardiologist:  None   {  Discharge Diagnoses    Principal Problem:   NSTEMI (non-ST elevated myocardial infarction) [facility]) Active Problems:   Recurrent Syncope and collapse   HLD (hyperlipidemia)   Diagnostic Studies/Procedures    Left heart catheterization  the left ventricular systolic function is normal.   LV end diastolic pressure is moderately elevated.   The left ventricular ejection fraction is 55-65% by visual estimate.   Normal coronary anatomy Normal LV function Moderately elevated LVEDP (post hydration)   Plan: Echo for completeness. Anticipate same day DC  _____________   History of Present Illness     Bobby Green is a 58 y.o. male with history of recurrent syncope, HLD, prior renal insufficiency in labs (Cr 1.6 in 2011, 1.43 in 05/2018.  Bobby Green is originally from [place] but has been here in the [place]  for 27 years. He is an personnel officer. He has no formal cardiac history though does report he he has longstanding history of 4-5 prior episodes of syncope in the last 2-3 decades. The last episode was about 2 years ago. He remembers having a remote stress test for one episode that was reportedly OK. He gets a stereotypical lightheadedness when it happens. He reports he is usually out 15-20 minutes. There is no history of cardiac arrest. It looks like his creatinine has been elevated in the past by the spotty available labs noted above.   In general he is very active. He was working out at [employer] at [place] yesterday. He had only had a cup of coffee and glass of water that day. He had walked 3 miles and was using weight machines when he felt very lightheaded. He checked his BP and had a hard time getting a reading. He waited a few minutes and symptoms persisted. He then reportedly had a  syncope episode reported by bystanders. He thinks he was out very briefly. He still felt lightheaded after a few minutes of sitting so summoned care and was transported down to the ED in a wheelchair. Per his report he had an additional syncopal episode briefly while being wheeled down. He did not require any CPR or resuscitation. He denies any preceding chest pain or palpitations though did have some SOB. He denies any b/b incontinence or seizure activity these episodes. Initial BP 83/49, P 83, glucose 121. HsTroponin 31->401, Cr 1.84. UA wnl. He received 1L fluid bolus and was started on heparin  and given 324mg  ASA. Recheck Cr 1.19. EKG shows NSR with diffuse STTW changes with mild STE avL, biphasic ST/TW changes V2, TWI I, II, III, avF, V3-V6 with ST depression in II, III, avF, V3-V6. While similar territory of changes were noted in 2020, these appear much more pronounced. We do not have tele data available from [facility] but were not notified from ED team of any arrhythmic events. VSS otherwise stable this AM, not tachycardic, tachypneic or hypoxic. He is NSR on arrival, HR 50s, without acute complaint.   He has no family hx of cardiac disease. His cousin's husband is a cardiologist and will also be helping him review his results.  Hospital Course     Consultants:    Syncope NSTEMI Etiology not entirely clear possibly due to dehydration but he did report loss of consciousness.  This has been a recurrent  issue over the past several decades.  Current presentation seems exacerbated by dehydration at the very least.  His EKG did look concerning for ischemic disease with diffuse ST depressions.  Troponins elevated at 1400+.  He was taken for left heart cath that demonstrated preserved LVEF with normal coronary anatomy with moderately elevated LVEDP.  Patient has been anxious to discharge and MD okay with this, given their discussion will continue to evaluate this outpatient. Outpatient echocardiogram has been  scheduled for early next week, possible concerns for obstructive pathology.  They will call and verify with patient. Will also discharge on 2-week live monitor that will be sent to him Patient has been cautioned about driving and working.  Although no explicit restrictions, would be more conservative in avoiding these until further workup can be followed up with. Post-cath instructions have been given and updated in AVS Encourage good fluid hydration.  AKI On admission creatinine was 1.84 after IV hydration now normalized 1.19.  Hyperlipidemia LDL here was 195.  Discuss outpatient changing of pravastatin  40 mg to high intensity statin.  Per Dr. Jordan..   Did the patient have an acute coronary syndrome (MI, NSTEMI, STEMI, etc) this admission?:  No.   The elevated Troponin was due to the acute medical illness (demand ischemia).   Patient seen and examined by Dr. Jordan and deemed stable for discharge.  I will arrange follow-up for 2 weeks.  Heart monitor will be sent to patient.  We will check a magnesium before he goes.  The patient will be scheduled for a TOC follow up appointment in 14 days.  A message has been sent to the [facility] and Scheduling Pool at the office where the patient should be seen for follow up.  _____________  Discharge Vitals Blood pressure 128/78, pulse 60, temperature 98.3 F (36.8 C), temperature source Oral, resp. rate 15, height 5' 11 (1.803 m), weight 111.1 kg, SpO2 97%.  Filed Weights   06/09/23 2102  Weight: 111.1 kg    Labs & Radiologic Studies    CBC Recent Labs    06/09/23 2114  WBC 8.6  HGB 13.9  HCT 42.4  MCV 80.3  PLT 254   Basic Metabolic Panel Recent Labs    98/90/74 2114 06/10/23 0741  NA 140 135  K 3.5 3.9  CL 102 104  CO2 25 25  GLUCOSE 105* 101*  BUN 20 16  CREATININE 1.84* 1.19  CALCIUM 9.2 8.7*   Liver Function Tests Recent Labs    06/10/23 0741  AST 22  ALT 17  ALKPHOS 39  BILITOT 0.6  PROT 6.5  ALBUMIN 4.0    No results for input(s): LIPASE, AMYLASE in the last 72 hours. High Sensitivity Troponin:   Recent Labs  Lab 06/09/23 2114 06/09/23 2338 06/10/23 0741  TROPONINIHS 31* 401* 1,405*    BNP Invalid input(s): POCBNP D-Dimer No results for input(s): DDIMER in the last 72 hours. Hemoglobin A1C No results for input(s): HGBA1C in the last 72 hours. Fasting Lipid Panel Recent Labs    06/10/23 0741  CHOL 257*  HDL 45  LDLCALC 195*  TRIG 85  CHOLHDL 5.7   Thyroid  Function Tests No results for input(s): TSH, T4TOTAL, T3FREE, THYROIDAB in the last 72 hours.  Invalid input(s): FREET3 _____________  CARDIAC CATHETERIZATION Result Date: 06/10/2023   The left ventricular systolic function is normal.   LV end diastolic pressure is moderately elevated.   The left ventricular ejection fraction is 55-65% by visual estimate. Normal coronary  anatomy Normal LV function Moderately elevated LVEDP (post hydration) Plan: Echo for completeness. Anticipate same day DC   Disposition   Pt is being discharged home today in good condition.  Follow-up Plans & Appointments     Follow-up Information     Emelia Josefa HERO, NP Follow up.   Specialty: Cardiology Why: Friday Jun 24, 2023 Appt at 8:25 AM (25 min) Contact information: [address] [place] [address] [phone]                   Discharge Medications   Allergies as of 06/10/2023   No Known Allergies      Medication List     TAKE these medications    pravastatin  40 MG tablet Commonly known as: PRAVACHOL            Outstanding Labs/Studies    Duration of Discharge Encounter: APP Time: 40 minutes   Signed, Thom LITTIE Sluder, PA-C 06/10/2023, 4:59 PM

## 2023-06-10 NOTE — Progress Notes (Signed)
 Arrived from [address] to [facility]. Denies discomfort. Alert and oriented. Arrived w/Heparin infusing at 13cc/hr to rt forearm. 0.9 NS infusing at 100cc/hr to rt forearm.

## 2023-06-12 ENCOUNTER — Encounter (HOSPITAL_COMMUNITY): Payer: Self-pay | Admitting: Cardiology

## 2023-06-13 ENCOUNTER — Other Ambulatory Visit: Payer: No Typology Code available for payment source

## 2023-06-13 ENCOUNTER — Encounter: Payer: Self-pay | Admitting: *Deleted

## 2023-06-13 DIAGNOSIS — R55 Syncope and collapse: Secondary | ICD-10-CM

## 2023-06-13 NOTE — Telephone Encounter (Signed)
 Attempted to call patient, no answer left message requesting a call back.

## 2023-06-13 NOTE — Telephone Encounter (Signed)
 Patient identification verified by 2 forms. Bertina Cooks, RN    Patient contacted regarding discharge from Bobby Green on 06/10/23  Patient understands to follow up with provider NP Cleaver on 06/24/23 at 8:25am at [facility] Patient understands discharge instructions? Yes Patient understands medications and regiment? N/A Patient understands to bring all medications to this visit? Yes   Patient states:  -did not receive Rx for Pravastatin   -Request RX sent to [facility] [address] [facility]    Informed patient message sent for Rx request

## 2023-06-13 NOTE — Progress Notes (Unsigned)
 Enrolled for [employer] to mail a ZIO AT Live Telemetry monitor to patients address on file.   Dr. Swaziland to read.  Letter with instructions mailed to patient.

## 2023-06-13 NOTE — Telephone Encounter (Signed)
 Pt returning nurse's call. Please advise

## 2023-06-14 MED ORDER — PRAVASTATIN SODIUM 40 MG PO TABS: 40.0000 mg | ORAL_TABLET | Freq: Every evening | ORAL | 3 refills | Status: DC

## 2023-06-14 NOTE — Telephone Encounter (Signed)
 Patient identification verified by 2 forms. Marilynn Rail, RN    Called and spoke to patient Informed patient Rx sent to requested pharmacy  Reviewed Rx instruction/education  Patient verbalized understanding, no questions at this time

## 2023-06-14 NOTE — Progress Notes (Deleted)
 MD aware that patient is not meeting BP parameters despite pressors. New orders received. Providers at bedside.

## 2023-06-14 NOTE — Telephone Encounter (Signed)
 Emelia Josefa HERO, NP  You1 hour ago (6:11 AM)    Yes, please send prescription for pravastatin .  Thank you for your help.  Josefa HERO. Cleaver NP-C  06/14/2023, 6:11 AM [facility] [address] Office [phone] Fax [phone]

## 2023-06-17 ENCOUNTER — Ambulatory Visit (INDEPENDENT_AMBULATORY_CARE_PROVIDER_SITE_OTHER): Payer: No Typology Code available for payment source

## 2023-06-17 DIAGNOSIS — R55 Syncope and collapse: Secondary | ICD-10-CM

## 2023-06-17 MED ORDER — PERFLUTREN LIPID MICROSPHERE
1.0000 mL | INTRAVENOUS | Status: AC | PRN
  Administered 2023-06-17: 4 mL via INTRAVENOUS

## 2023-06-21 NOTE — Progress Notes (Deleted)
 Cardiology Clinic Note   Patient Name: Bobby Green Date of Encounter: 06/21/2023  Primary Care Provider:  Leilani Able, MD Primary Cardiologist:  None  Patient Profile    ***  Past Medical History    Past Medical History:  Diagnosis Date   Hyperlipidemia    Past Surgical History:  Procedure Laterality Date   LEFT HEART CATH AND CORONARY ANGIOGRAPHY N/A 06/10/2023   Procedure: LEFT HEART CATH AND CORONARY ANGIOGRAPHY;  Surgeon: Swaziland, Peter M, MD;  Location: [facility] INVASIVE CV LAB;  Service: Cardiovascular;  Laterality: N/A;    Allergies  No Known Allergies  History of Present Illness    ***  Home Medications    Prior to Admission medications   Medication Sig Start Date End Date Taking? Authorizing Provider  pravastatin (PRAVACHOL) 40 MG tablet Take 1 tablet (40 mg total) by mouth every evening. 06/14/23 09/12/23  Ronney Asters, NP    Family History    Family History  Problem Relation Age of Onset   Heart disease Neg Hx    He indicated that the status of his neg hx is unknown.  Social History    Social History   Socioeconomic History   Marital status: Married    Spouse name: Not on file   Number of children: Not on file   Years of education: Not on file   Highest education level: Not on file  Occupational History   Not on file  Tobacco Use   Smoking status: Never   Smokeless tobacco: Never  Substance and Sexual Activity   Alcohol use: Yes    Comment: 2x/month   Drug use: Never   Sexual activity: Not on file  Other Topics Concern   Not on file  Social History Narrative   Not on file   Social Drivers of Health   Financial Resource Strain: Not on file  Food Insecurity: Not on file  Transportation Needs: Not on file  Physical Activity: Not on file  Stress: Not on file  Social Connections: Not on file  Intimate Partner Violence: Not on file     Review of Systems    General:  No chills, fever, night sweats or weight changes.   Cardiovascular:  No chest pain, dyspnea on exertion, edema, orthopnea, palpitations, paroxysmal nocturnal dyspnea. Dermatological: No rash, lesions/masses Respiratory: No cough, dyspnea Urologic: No hematuria, dysuria Abdominal:   No nausea, vomiting, diarrhea, bright red blood per rectum, melena, or hematemesis Neurologic:  No visual changes, wkns, changes in mental status. All other systems reviewed and are otherwise negative except as noted above.  Physical Exam    VS:  There were no vitals taken for this visit. , BMI There is no height or weight on file to calculate BMI. GEN: Well nourished, well developed, in no acute distress. HEENT: normal. Neck: Supple, no JVD, carotid bruits, or masses. Cardiac: RRR, no murmurs, rubs, or gallops. No clubbing, cyanosis, edema.  Radials/DP/PT 2+ and equal bilaterally.  Respiratory:  Respirations regular and unlabored, clear to auscultation bilaterally. GI: Soft, nontender, nondistended, BS + x 4. MS: no deformity or atrophy. Skin: warm and dry, no rash. Neuro:  Strength and sensation are intact. Psych: Normal affect.  Accessory Clinical Findings    Recent Labs: 06/09/2023: Hemoglobin 13.9; Platelets 254 06/10/2023: ALT 21; BUN 16; Creatinine, Ser 1.19; Magnesium 2.1; Potassium 3.9; Sodium 135; TSH 0.579   Recent Lipid Panel    Component Value Date/Time   CHOL 250 (H) 06/10/2023 1745   TRIG  101 06/10/2023 1745   HDL 43 06/10/2023 1745   CHOLHDL 5.8 06/10/2023 1745   VLDL 20 06/10/2023 1745   LDLCALC 187 (H) 06/10/2023 1745    No BP recorded.  {Refresh Note OR Click here to enter BP  :1}***    ECG personally reviewed by me today- ***          Assessment & Plan   1.  ***   Thomasene Ripple. Anshika Pethtel NP-C     06/21/2023, 12:26 PM [facility] [address] Office [phone] Fax [phone]    I spent***minutes examining this patient, reviewing medications, and using patient centered  shared decision making involving their cardiac care.   I spent greater than 20 minutes reviewing their past medical history,  medications, and prior cardiac tests.

## 2023-06-24 ENCOUNTER — Ambulatory Visit: Payer: No Typology Code available for payment source | Admitting: General Practice

## 2023-06-24 ENCOUNTER — Encounter: Payer: Self-pay | Admitting: Emergency Medicine

## 2023-06-24 ENCOUNTER — Ambulatory Visit: Payer: No Typology Code available for payment source | Attending: Physician Assistant | Admitting: Emergency Medicine

## 2023-06-24 VITALS — BP 132/86 | HR 72 | Ht 70.0 in | Wt 252.0 lb

## 2023-06-24 DIAGNOSIS — E785 Hyperlipidemia, unspecified: Secondary | ICD-10-CM

## 2023-06-24 NOTE — Patient Instructions (Signed)
Medication Instructions:  NO CHANGES *If you need a refill on your cardiac medications before your next appointment, please call your pharmacy*   Lab Work: NO LABS If you have labs (blood work) drawn today and your tests are completely normal, you will receive your results only by: MyChart Message (if you have MyChart) OR A paper copy in the mail If you have any lab test that is abnormal or we need to change your treatment, we will call you to review the results.   Testing/Procedures: NO TESTING   Follow-Up: At [facility], you and your health needs are our priority.  As part of our continuing mission to provide you with exceptional heart care, we have created designated Provider Care Teams.  These Care Teams include your primary Cardiologist (physician) and Advanced Practice Providers (APPs -  Physician Assistants and Nurse Practitioners) who all work together to provide you with the care you need, when you need it.  Your next appointment:   4 month(s)  Provider:   Peter Swaziland, MD  Other Instructions

## 2023-06-24 NOTE — Progress Notes (Signed)
Cardiology Office Note:    Date:  06/24/2023  ID:  Bobby Green, DOB 02-Apr-1966, MRN [id number] PCP: Leilani Able, MD  [facility] Providers Cardiologist:  Peter Swaziland, MD       Patient Profile:      Bobby Green is a 58 y.o. male with visit-pertinent history of recurrent syncope, hyperlipidemia, renal insufficiency, and NSTEMI.  He was admitted to the hospital on 06/10/2023 and discharged later that day.  He is from [place].  He has lived in the [place] for 27 years.  He denied formal cardiac history.  He did note longstanding history of 4-5 episodes of previous syncope over the last 20-30 years.  He noted lightheadedness prior to his events.  He indicated that he would pass out for 15-20 minutes.  He denied history of cardiac arrest.  He was working out at [employer] at [facility] on 06/09/2023.  He reported that he had consumed a cup of coffee and a glass of water that day.  He had walked around 3 miles and was using a weight machine when he felt lightheaded.  He checked his blood pressure and had a hard time obtaining a blood pressure reading.  He waited a few minutes.  His symptoms persisted.  He then had a syncopal event.  He reported that he had passed out for only a brief period of time.  When he regained consciousness he still felt lightheaded and was transported to the emergency department in a wheelchair.  He reported a subsequent syncopal event while being wheeled down to the emergency department.  He denied chest pain and palpitations.  He did have some shortness of breath.  He denied bowel and bladder incontinence or seizure-like activity.  His blood pressure was noted to be 83/49.  His pulse was 83.  His glucose was 121.  His high-sensitivity troponins were 31 and increased to 401.  He received a 1 L fluid bolus and was started on heparin and given aspirin.  His EKG showed diffuse ST and T wave changes with mild ST elevation.  He was noted to have changes in V2, T wave inversion 1, 2,  3, aVF, V3-V6 and ST depression in 2, 3, aVF, V3 through V6.  His vital signs were otherwise stable.  He underwent left heart catheterization which showed normal LV function, normal coronary anatomy, and moderately elevated LVEDP post hydration.  He was discharged on the same day.  There was no clear etiology for his symptoms.  It was felt to be possibly due to dehydration.  Outpatient echocardiogram was completed outpatient on 06/17/2023 showing LVEF 65-70%, no RWMA, RV SF normal, trivial mitral valve regurgitation, trivial aortic valve regurgitation, aortic valve sclerosis/calcification is present.  A 2-week while cardiac event monitor was also ordered.  His LDL was noted to be 195.  It was felt that his pravastatin should be transition to high-dose statin.  His cardiac event monitor has not yet resulted.      History of Present Illness:  Discussed the use of AI scribe software for clinical note transcription with the patient, who gave verbal consent to proceed.  Bobby Green is a 58 y.o. male who returns for hospital follow up for syncope.   [name] arrives to clinic today with his wife. He is an active individual who has began to recently work out to lose weight.  Since the incident, the patient has been resting and is eager to return to his workout routine. He notes syncope occurred  while he was lifting weights and not on treadmill. He reports no further episodes of syncope or lightheadedness. He states he has had 3-4 similar events occur in the past 20-30 years which have occurred when he is physically exerting himself.   Review of Systems  Constitutional: Negative for weight gain and weight loss.  Cardiovascular:  Negative for chest pain, claudication, dyspnea on exertion, irregular heartbeat, leg swelling, near-syncope, orthopnea, palpitations, paroxysmal nocturnal dyspnea and syncope.  Respiratory:  Negative for cough, hemoptysis and shortness of breath.   Gastrointestinal:  Negative for  abdominal pain, hematochezia and melena.  Genitourinary:  Negative for hematuria.  Neurological:  Negative for dizziness and light-headedness.     See HPI      Home Medications:    Prior to Admission medications   Medication Sig Start Date End Date Taking? Authorizing Provider  pravastatin (PRAVACHOL) 40 MG tablet Take 1 tablet (40 mg total) by mouth every evening. 06/14/23 09/12/23  Ronney Asters, NP   Studies Reviewed:       Echocardiogram 06/17/2023  1. Left ventricular ejection fraction, by estimation, is 65 to 70%. The  left ventricle has normal function. The left ventricle has no regional  wall motion abnormalities. Left ventricular diastolic parameters were  normal.   2. Right ventricular systolic function is normal. The right ventricular  size is normal.   3. Trivial mitral valve regurgitation.   4. The aortic valve is tricuspid. Aortic valve regurgitation is trivial.  Aortic valve sclerosis/calcification is present, without any evidence of  aortic stenosis.   5. The inferior vena cava is normal in size with greater than 50%  respiratory variability, suggesting right atrial pressure of 3 mmHg.   Left heart catheterization 06/10/2023     The left ventricular systolic function is normal.   LV end diastolic pressure is moderately elevated.   The left ventricular ejection fraction is 55-65% by visual estimate.   Normal coronary anatomy Normal LV function Moderately elevated LVEDP (post hydration) Diagnostic Dominance: Co-dominant   Risk Assessment/Calculations:             Physical Exam:   VS:  BP 132/86 (BP Location: Left Arm, Patient Position: Sitting, Cuff Size: Large)   Pulse 72   Ht 5\' 10"  (1.778 m)   Wt 252 lb (114.3 kg)   SpO2 99%   BMI 36.16 kg/m    Wt Readings from Last 3 Encounters:  06/24/23 252 lb (114.3 kg)  06/09/23 245 lb (111.1 kg)  06/16/18 243 lb (110.2 kg)    Constitutional:      Appearance: Normal and healthy appearance. Not in  distress.  Neck:     Vascular: JVD normal.  Pulmonary:     Effort: Pulmonary effort is normal.     Breath sounds: Normal breath sounds.  Chest:     Chest wall: Not tender to palpatation.  Cardiovascular:     PMI at left midclavicular line. Normal rate. Regular rhythm. Normal S1. Normal S2.      Murmurs: There is no murmur.     No gallop.  No click. No rub.  Pulses:    Intact distal pulses.  Edema:    Peripheral edema absent.  Musculoskeletal: Normal range of motion.     Cervical back: Normal range of motion and neck supple. Skin:    General: Skin is warm and dry.  Neurological:     General: No focal deficit present.     Mental Status: Alert, oriented to person, place, and  time and oriented to person, place and time.  Psychiatric:        Mood and Affect: Mood and affect normal.        Behavior: Behavior is cooperative.        Thought Content: Thought content normal.        Assessment and Plan:  NSTEMI / syncope Denies further episodes of lightheadedness/syncope.  Experienced syncopal episode while at the gym.  Was noted to have elevated cardiac troponins and underwent LHC.  He was noted to have normal LV function and normal coronary anatomy. Echo was unremarkable  Cardiac event monitor was ordered but has not yet resulted -Consider loop recorder in future if syncope continues given this has occurred 4-5 times over past 20-30 years -Advised patient to gradually return to exercise, avoiding excessive exertion. -Encourage hydration and electrolyte replenishment during exercise, especially if fasting. -Heart healthy low-sodium diet, gradually begin physical activity -Continue statin therapy  Hyperlipidemia LDL 187 on 06/10/2023 -Continue Pravastatin 40mg  daily. He noted myalgias to high-intensity statins  -Repeat fasting lipids and LFTs in 6-8 weeks -High-fiber diet  AKI Creatinine noted to be 1.84 on admission.  Normalized to 1.19 after IV hydration. Maintain p.o.  hydration -Follows with PCP            Dispo:  Return in about 4 months (around 10/22/2023).  Signed, Denyce Robert, NP

## 2023-07-14 ENCOUNTER — Telehealth: Payer: Self-pay | Admitting: *Deleted

## 2023-07-14 NOTE — Telephone Encounter (Signed)
Attempt to contact patient to inform him about the no charge status on his first ZIO AT recorder.  [employer] is sending him a replacement ZIO AT to wear for 14 days. No answer, call went straight to VM.  Left message to call Shavon Zenz in monitors at [phone].

## 2023-07-14 NOTE — Telephone Encounter (Signed)
Hi,  Just wanted to provide more information on this device.   The patient informed us that he saw flashing lights on the monitor after he showered. This may have damaged the device and caused issues with the data extraction process. The patient will not be charged for this monitor due to this.  This was a Zio AT device, so all of the patient's daily reports are available in ZioSuite if the physician would like to review those. Please let me know if you have any questions! From: [email or web address]> Sent: Wednesday, July 13, 2023 3:56 PM      Hello,   We received the following device in house and attempted to upload the data.  Unfortunately, there was an error and we were unable to complete the data extraction. I apologize for the inconvenience.  I suggest re-patching the patient.    Asset Serial Number: [id number] Patient Initials: C. C. Patient ID: [id number]   If you have any questions, please feel free to contact us at [phone] and reference: [id number]   [employer] [phone] [email or web address]  [email or web address]

## 2023-07-14 NOTE — Telephone Encounter (Signed)
Discussed with [name] that although his monitor was transmitting 06/19/23 thru 06/30/23. [employer] was not able to pull data from his actual ZIO patch.  This makes it impossible to create a final report for his physician to read. [employer] has cancelled charges on the first monitor and are sending a replacement monitor for him to wear. Patient does not want to wear the replacement monitor.  He said the first one interfered with his lifestyle.  He would like this information presented to Dr. [place] to see if it is ok that he does not wear the replacement monitor, (we would cancel the order).  Patient will await his decision.

## 2023-07-18 NOTE — Telephone Encounter (Signed)
 Spoke to patient Dr.Jordan's advice given.

## 2023-07-28 LAB — ECHOCARDIOGRAM COMPLETE
AR max vel: 3.5 cm2
AV Area VTI: 3.53 cm2
AV Area mean vel: 3.51 cm2
AV Mean grad: 3 mmHg
AV Peak grad: 5.9 mmHg
AV Vena cont: 0.2 cm
Ao pk vel: 1.21 m/s
Area-P 1/2: 4.29 cm2
S' Lateral: 3.37 cm

## 2023-10-13 NOTE — Progress Notes (Signed)
 Cardiology Office Note:    Date:  10/17/2023   ID:  Bobby Green, DOB 11-Nov-1965, MRN [id number]  PCP:  Danella Dunn, MD   [facility] Providers Cardiologist:  Zymier Rodgers [place], MD     Referring MD: Danella Dunn, MD   No chief complaint on file.   History of Present Illness:    Bobby Green is a 58 y.o. male seen for follow up syncope. He was admitted to the hospital on 06/10/2023 following a syncopal episode while working out in the gym.  He did note  history of 4-5 episodes of previous syncope over the last 20-30 years.  He noted lightheadedness prior to his events.  He indicated that he would pass out for 15-20 minutes.  He denied history of cardiac arrest. On presentation in January he was hypotensive with BP 83/49. Pulse 83. HS troponin 31>>401. He had repolarization changes on Ecg. He underwent left heart catheterization which showed normal LV function, normal coronary anatomy, and moderately elevated LVEDP post hydration.  He was discharged on the same day.  There was no clear etiology for his symptoms.  It was felt to be possibly due to dehydration.  Outpatient echo showed LVEF 65-70%, no RWMA, RV SF normal, trivial mitral valve regurgitation, trivial aortic valve regurgitation, aortic valve sclerosis/calcification is present.  A 2-week while cardiac event monitor was also ordered but never completed.    On follow up today he has no complaints. No recurrent dizziness or syncope. Hasn't really been back to gym due to his work.   Past Medical History:  Diagnosis Date   Hyperlipidemia     Past Surgical History:  Procedure Laterality Date   LEFT HEART CATH AND CORONARY ANGIOGRAPHY N/A 06/10/2023   Procedure: LEFT HEART CATH AND CORONARY ANGIOGRAPHY;  Surgeon: [place], Hector Taft M, MD;  Location: [facility] INVASIVE CV LAB;  Service: Cardiovascular;  Laterality: N/A;    Current Medications: No outpatient medications have been marked as taking for the 10/17/23 encounter (Office Visit) with  [place], Jamell Opfer M, MD.     Allergies:   Patient has no known allergies.   Social History   Socioeconomic History   Marital status: Married    Spouse name: Not on file   Number of children: Not on file   Years of education: Not on file   Highest education level: Not on file  Occupational History   Not on file  Tobacco Use   Smoking status: Never   Smokeless tobacco: Never  Substance and Sexual Activity   Alcohol use: Yes    Comment: 2x/month   Drug use: Never   Sexual activity: Not on file  Other Topics Concern   Not on file  Social History Narrative   Not on file   Social Drivers of Health   Financial Resource Strain: Not on file  Food Insecurity: Not on file  Transportation Needs: Not on file  Physical Activity: Not on file  Stress: Not on file  Social Connections: Not on file     Family History: The patient's family history is negative for Heart disease.  ROS:   Please see the history of present illness.     All other systems reviewed and are negative.  EKGs/Labs/Other Studies Reviewed:    The following studies were reviewed today: Cardiac cath 06/10/23:  LEFT HEART CATH AND CORONARY ANGIOGRAPHY   Conclusion      The left ventricular systolic function is normal.   LV end diastolic pressure is moderately elevated.  The left ventricular ejection fraction is 55-65% by visual estimate.   Normal coronary anatomy Normal LV function Moderately elevated LVEDP (post hydration)   Plan: Echo for completeness. Anticipate same day DC   Echo 06/17/23: IMPRESSIONS     1. Left ventricular ejection fraction, by estimation, is 65 to 70%. The  left ventricle has normal function. The left ventricle has no regional  wall motion abnormalities. Left ventricular diastolic parameters were  normal.   2. Right ventricular systolic function is normal. The right ventricular  size is normal.   3. Trivial mitral valve regurgitation.   4. The aortic valve is tricuspid. Aortic  valve regurgitation is trivial.  Aortic valve sclerosis/calcification is present, without any evidence of  aortic stenosis.   5. The inferior vena cava is normal in size with greater than 50%  respiratory variability, suggesting right atrial pressure of 3 mmHg.        Recent Labs: 06/09/2023: Hemoglobin 13.9; Platelets 254 06/10/2023: ALT 21; BUN 16; Creatinine, Ser 1.19; Magnesium 2.1; Potassium 3.9; Sodium 135; TSH 0.579  Recent Lipid Panel    Component Value Date/Time   CHOL 250 (H) 06/10/2023 1745   TRIG 101 06/10/2023 1745   HDL 43 06/10/2023 1745   CHOLHDL 5.8 06/10/2023 1745   VLDL 20 06/10/2023 1745   LDLCALC 187 (H) 06/10/2023 1745     Risk Assessment/Calculations:                Physical Exam:    VS:  BP 132/88   Pulse (!) 57   Ht 5\' 10"  (1.778 m)   Wt 247 lb (112 kg)   BMI 35.44 kg/m     Wt Readings from Last 3 Encounters:  10/17/23 247 lb (112 kg)  06/24/23 252 lb (114.3 kg)  06/09/23 245 lb (111.1 kg)     GEN:  Well nourished, well developed in no acute distress HEENT: Normal NECK: No JVD; No carotid bruits LYMPHATICS: No lymphadenopathy CARDIAC: RRR, no murmurs, rubs, gallops RESPIRATORY:  Clear to auscultation without rales, wheezing or rhonchi  ABDOMEN: Soft, non-tender, non-distended MUSCULOSKELETAL:  No edema; No deformity  SKIN: Warm and dry NEUROLOGIC:  Alert and oriented x 3 PSYCHIATRIC:  Normal affect   ASSESSMENT:    1. Syncope and collapse   2. Hyperlipidemia LDL goal <70    PLAN:    In order of problems listed above:  Syncope. No recurrence. Likely related to dehydration. Echo and cardiac cath unremarkable. Recommend maintaining good hydration. Can follow up with us   PRN           Medication Adjustments/Labs and Tests Ordered: Current medicines are reviewed at length with the patient today.  Concerns regarding medicines are outlined above.  No orders of the defined types were placed in this encounter.  No orders of the  defined types were placed in this encounter.   There are no Patient Instructions on file for this visit.   Signed, Antwaine Boomhower [place], MD  10/17/2023 8:41 AM    [facility]

## 2023-10-17 ENCOUNTER — Ambulatory Visit: Payer: Self-pay | Attending: Cardiology | Admitting: Cardiology

## 2023-10-17 VITALS — BP 132/88 | HR 57 | Ht 70.0 in | Wt 247.0 lb

## 2023-10-17 DIAGNOSIS — E785 Hyperlipidemia, unspecified: Secondary | ICD-10-CM

## 2023-10-17 DIAGNOSIS — R55 Syncope and collapse: Secondary | ICD-10-CM

## 2023-10-17 MED ORDER — PRAVASTATIN SODIUM 40 MG PO TABS: 40.0000 mg | ORAL_TABLET | Freq: Every evening | ORAL | 3 refills | Status: AC

## 2023-10-17 NOTE — Patient Instructions (Signed)
 Medication Instructions:  Continue same medication *If you need a refill on your cardiac medications before your next appointment, please call your pharmacy*  Lab Work: None ordered  Testing/Procedures: None ordered  Follow-Up: At [facility], you and your health needs are our priority.  As part of our continuing mission to provide you with exceptional heart care, our providers are all part of one team.  This team includes your primary Cardiologist (physician) and Advanced Practice Providers or APPs (Physician Assistants and Nurse Practitioners) who all work together to provide you with the care you need, when you need it.  Your next appointment:  As Needed    Provider:  Dr.Jordan   We recommend signing up for the patient portal called "MyChart".  Sign up information is provided on this After Visit Summary.  MyChart is used to connect with patients for Virtual Visits (Telemedicine).  Patients are able to view lab/test results, encounter notes, upcoming appointments, etc.  Non-urgent messages can be sent to your provider as well.   To learn more about what you can do with MyChart, go to [email or web address].

## 2023-10-17 NOTE — Addendum Note (Signed)
 Addended by: Sherryn Donalds on: 10/17/2023 08:48 AM   Modules accepted: Orders

## 2023-11-07 ENCOUNTER — Ambulatory Visit
Admission: EM | Admit: 2023-11-07 | Discharge: 2023-11-07 | Disposition: A | Discharge status: Home / Self Care | Attending: Family Medicine | Admitting: Family Medicine

## 2023-11-07 DIAGNOSIS — S80861A Insect bite (nonvenomous), right lower leg, initial encounter: Secondary | ICD-10-CM

## 2023-11-07 DIAGNOSIS — W57XXXA Bitten or stung by nonvenomous insect and other nonvenomous arthropods, initial encounter: Secondary | ICD-10-CM

## 2023-11-07 DIAGNOSIS — R21 Rash and other nonspecific skin eruption: Secondary | ICD-10-CM | POA: Diagnosis not present

## 2023-11-07 MED ORDER — DOXYCYCLINE HYCLATE 100 MG PO CAPS: 100.0000 mg | ORAL_CAPSULE | Freq: Two times a day (BID) | ORAL | 0 refills | Status: AC

## 2023-11-07 NOTE — Discharge Instructions (Addendum)
 Advised patient to take medication as directed with food to completion.  Encouraged to increase daily water intake to 64 ounces per day while taking this medication.  Advised if symptoms worsen and/or unresolved please follow-up with your PCP or here for further evaluation.

## 2023-11-07 NOTE — ED Provider Notes (Signed)
 Bobby Green [facility]    CSN: [id number] Arrival date & time: 11/07/23  1113      History   Chief Complaint Chief Complaint  Patient presents with   Tick Removal    Bite     HPI Bobby Green is a 58 y.o. male.   HPI pleasant 58 year old male presents with a tick bite of the right hip area that occurred yesterday.  Additionally, patient reports pulling off a tick from the left hip 1 month ago.  PMH significant for CAD (s/p non-STEMI), recurrent syncope and collapse, and HLD.  Past Medical History:  Diagnosis Date   Hyperlipidemia     Patient Active Problem List   Diagnosis Date Noted   NSTEMI (non-ST elevated myocardial infarction) (HCC) 06/10/2023   Recurrent Syncope and collapse 06/10/2023   HLD (hyperlipidemia) 06/10/2023    Past Surgical History:  Procedure Laterality Date   LEFT HEART CATH AND CORONARY ANGIOGRAPHY N/A 06/10/2023   Procedure: LEFT HEART CATH AND CORONARY ANGIOGRAPHY;  Surgeon: [place], Peter M, MD;  Location: [facility] INVASIVE CV LAB;  Service: Cardiovascular;  Laterality: N/A;       Home Medications    Prior to Admission medications   Medication Sig Start Date End Date Taking? Authorizing Provider  doxycycline (VIBRAMYCIN) 100 MG capsule Take 1 capsule (100 mg total) by mouth 2 (two) times daily for 10 days. 11/07/23 11/17/23 Yes Leonides Ramp, FNP  pravastatin  (PRAVACHOL ) 40 MG tablet Take 1 tablet (40 mg total) by mouth every evening. 10/17/23 01/15/24  [place], Peter M, MD    Family History Family History  Problem Relation Age of Onset   Healthy Mother    Healthy Father    Heart disease Neg Hx     Social History Social History   Tobacco Use   Smoking status: Never   Smokeless tobacco: Never  Substance Use Topics   Alcohol use: Yes    Comment: 2x/month   Drug use: Never     Allergies   Patient has no known allergies.   Review of Systems Review of Systems  Skin:  Positive for rash and wound.  All other systems reviewed and are  negative.    Physical Exam Triage Vital Signs ED Triage Vitals  Encounter Vitals Group     BP      Systolic BP Percentile      Diastolic BP Percentile      Pulse      Resp      Temp      Temp src      SpO2      Weight      Height      Head Circumference      Peak Flow      Pain Score      Pain Loc      Pain Education      Exclude from Growth Chart    No data found.  Updated Vital Signs BP 124/83   Pulse 65   Temp 98 F (36.7 C)   Resp 20   SpO2 98%      Physical Exam Vitals and nursing note reviewed.  Constitutional:      Appearance: Normal appearance. He is normal weight.  HENT:     Head: Normocephalic and atraumatic.     Mouth/Throat:     Mouth: Mucous membranes are moist.     Pharynx: Oropharynx is clear.  Eyes:     Extraocular Movements: Extraocular movements intact.     Conjunctiva/sclera:  Conjunctivae normal.     Pupils: Pupils are equal, round, and reactive to light.  Cardiovascular:     Rate and Rhythm: Normal rate and regular rhythm.     Pulses: Normal pulses.     Heart sounds: Normal heart sounds.  Pulmonary:     Effort: Pulmonary effort is normal.     Breath sounds: Normal breath sounds. No wheezing, rhonchi or rales.  Musculoskeletal:        General: Normal range of motion.  Skin:    General: Skin is warm and dry.     Comments: Right hip (inferior aspect):~1.5 cm x 1.5 cm circular shaped area of erythema-please see image below  Neurological:     General: No focal deficit present.     Mental Status: He is alert and oriented to person, place, and time.  Psychiatric:        Mood and Affect: Mood normal.        Behavior: Behavior normal.      UC Treatments / Results  Labs (all labs ordered are listed, but only abnormal results are displayed) Labs Reviewed - No data to display  EKG   Radiology No results found.  Procedures Procedures (including critical care time)  Medications Ordered in UC Medications - No data to  display  Initial Impression / Assessment and Plan / UC Course  I have reviewed the triage vital signs and the nursing notes.  Pertinent labs & imaging results that were available during my care of the patient were reviewed by me and considered in my medical decision making (see chart for details).     MDM: 1.  Rash and nonspecific skin eruption-Rx'd doxycycline 100 mg capsule: Take 1 capsule twice daily x 10 days.  2.  Tick bite of right lower leg, initial encounter-Rx'd doxycycline 100 mg capsule: Take 1 capsule twice daily x 10 days. Advised patient to take medication as directed with food to completion.  Encouraged to increase daily water intake to 64 ounces per day while taking this medication.  Advised if symptoms worsen and/or unresolved please follow-up with your PCP or here for further evaluation.  Patient discharged home, hemodynamically stable. Final Clinical Impressions(s) / UC Diagnoses   Final diagnoses:  Tick bite of right lower leg, initial encounter  Rash and nonspecific skin eruption     Discharge Instructions      Advised patient to take medication as directed with food to completion.  Encouraged to increase daily water intake to 64 ounces per day while taking this medication.  Advised if symptoms worsen and/or unresolved please follow-up with your PCP or here for further evaluation.   ED Prescriptions     Medication Sig Dispense Auth. Provider   doxycycline (VIBRAMYCIN) 100 MG capsule Take 1 capsule (100 mg total) by mouth 2 (two) times daily for 10 days. 20 capsule Jamyson Jirak, FNP      PDMP not reviewed this encounter.   Leonides Ramp, FNP 11/07/23 1210

## 2023-11-07 NOTE — ED Triage Notes (Signed)
 Pt presents to uc with co of tick bite on left hip 1 month ago and found a second one yesterday on right leg. Reports no rash, or fever just wanting prophylactic antibiotics.

## 2024-02-20 ENCOUNTER — Ambulatory Visit
Admission: EM | Admit: 2024-02-20 | Discharge: 2024-02-20 | Disposition: A | Discharge status: Home / Self Care | Attending: Family Medicine | Admitting: Family Medicine

## 2024-02-20 ENCOUNTER — Encounter: Payer: Self-pay | Admitting: Emergency Medicine

## 2024-02-20 DIAGNOSIS — J209 Acute bronchitis, unspecified: Secondary | ICD-10-CM

## 2024-02-20 MED ORDER — AZITHROMYCIN 250 MG PO TABS: ORAL_TABLET | ORAL | 0 refills | Status: AC

## 2024-02-20 MED ORDER — PREDNISONE 50 MG PO TABS: ORAL_TABLET | ORAL | 0 refills | Status: AC

## 2024-02-20 NOTE — Discharge Instructions (Signed)
 Take the azithromycin  antibiotic as described.  Take 2 pills today then 1 a day until gone Take prednisone  once daily for 5 days Make sure you are drinking lots of fluids Use over-the-counter cough medicines like Mucinex DM or Delsym

## 2024-02-20 NOTE — ED Provider Notes (Signed)
 Bobby Green    CSN: [id number] Arrival date & time: 02/20/24  1110      History   Chief Complaint Chief Complaint  Patient presents with   Cough    HPI Bobby Green is a 58 y.o. male.   HPI  Patient states he has had a cough for about a week.  He feels like it is getting worse.  He is starting to feel weak.  He states that it hurts when he takes a deep breath and when he coughs.  He does not have asthma or emphysema.  He has been taking over-the-counter medications.  Past Medical History:  Diagnosis Date   Hyperlipidemia     Patient Active Problem List   Diagnosis Date Noted   NSTEMI (non-ST elevated myocardial infarction) (HCC) 06/10/2023   Recurrent Syncope and collapse 06/10/2023   HLD (hyperlipidemia) 06/10/2023    Past Surgical History:  Procedure Laterality Date   LEFT HEART CATH AND CORONARY ANGIOGRAPHY N/A 06/10/2023   Procedure: LEFT HEART CATH AND CORONARY ANGIOGRAPHY;  Surgeon: [place], Peter M, MD;  Location: [facility] INVASIVE CV LAB;  Service: Cardiovascular;  Laterality: N/A;       Home Medications    Prior to Admission medications   Medication Sig Start Date End Date Taking? Authorizing Provider  azithromycin  (ZITHROMAX  Z-PAK) 250 MG tablet Take two pills today followed by one a day until gone 02/20/24  Yes Maranda Jamee Jacob, MD  predniSONE  (DELTASONE ) 50 MG tablet Take once a day for 5 days.  Take with food 02/20/24  Yes Maranda Jamee Jacob, MD  pravastatin  (PRAVACHOL ) 40 MG tablet Take 1 tablet (40 mg total) by mouth every evening. 10/17/23 01/15/24  [place], Peter M, MD    Family History Family History  Problem Relation Age of Onset   Healthy Mother    Healthy Father    Heart disease Neg Hx     Social History Social History   Tobacco Use   Smoking status: Never   Smokeless tobacco: Never  Substance Use Topics   Alcohol use: Yes    Comment: 2x/month   Drug use: Never     Allergies   Patient has no known allergies.   Review  of Systems Review of Systems See HPI  Physical Exam Triage Vital Signs ED Triage Vitals  Encounter Vitals Group     BP 02/20/24 1131 (!) 142/88     Girls Systolic BP Percentile --      Girls Diastolic BP Percentile --      Boys Systolic BP Percentile --      Boys Diastolic BP Percentile --      Pulse Rate 02/20/24 1131 66     Resp --      Temp 02/20/24 1131 98.2 F (36.8 C)     Temp Source 02/20/24 1131 Oral     SpO2 02/20/24 1131 97 %     Weight --      Height --      Head Circumference --      Peak Flow --      Pain Score 02/20/24 1132 0     Pain Loc --      Pain Education --      Exclude from Growth Chart --    No data found.  Updated Vital Signs BP (!) 142/88 (BP Location: Right Arm)   Pulse 66   Temp 98.2 F (36.8 C) (Oral)   SpO2 97%       Physical Exam  Constitutional:      General: He is not in acute distress.    Appearance: He is well-developed and normal weight. He is not ill-appearing.  HENT:     Head: Normocephalic and atraumatic.     Right Ear: Tympanic membrane normal.     Left Ear: Tympanic membrane normal.     Nose: Nose normal.  Eyes:     Conjunctiva/sclera: Conjunctivae normal.     Pupils: Pupils are equal, round, and reactive to light.  Cardiovascular:     Rate and Rhythm: Normal rate and regular rhythm.     Heart sounds: Normal heart sounds.  Pulmonary:     Effort: Pulmonary effort is normal. No respiratory distress.     Breath sounds: Normal breath sounds.  Abdominal:     General: There is no distension.     Palpations: Abdomen is soft.  Musculoskeletal:        General: Normal range of motion.     Cervical back: Normal range of motion.  Skin:    General: Skin is warm and dry.  Neurological:     Mental Status: He is alert.      UC Treatments / Results  Labs (all labs ordered are listed, but only abnormal results are displayed) Labs Reviewed - No data to display  EKG   Radiology No results  found.  Procedures Procedures (including critical Green time)  Medications Ordered in UC Medications - No data to display  Initial Impression / Assessment and Plan / UC Course  I have reviewed the triage vital signs and the nursing notes.  Pertinent labs & imaging results that were available during my Green of the patient were reviewed by me and considered in my medical decision making (see chart for details).     Final Clinical Impressions(s) / UC Diagnoses   Final diagnoses:  Acute bronchitis, unspecified organism     Discharge Instructions      Take the azithromycin  antibiotic as described.  Take 2 pills today then 1 a day until gone Take prednisone  once daily for 5 days Make sure you are drinking lots of fluids Use over-the-counter cough medicines like Mucinex DM or Delsym   ED Prescriptions     Medication Sig Dispense Auth. Provider   predniSONE  (DELTASONE ) 50 MG tablet Take once a day for 5 days.  Take with food 5 tablet Maranda Jamee Jacob, MD   azithromycin  (ZITHROMAX  Z-PAK) 250 MG tablet Take two pills today followed by one a day until gone 6 tablet Maranda Jamee Jacob, MD      PDMP not reviewed this encounter.   Maranda Jamee Jacob, MD 02/20/24 1332

## 2024-02-20 NOTE — ED Triage Notes (Signed)
 Pt states since Thursday he has been coughing and having nasal congestion, unsure if febrile at home. Has been taking mucinex and vitamins.
